# Patient Record
Sex: Female | Born: 1995 | Race: White | Hispanic: No | Marital: Married | Smoking: Current every day smoker
Health system: Southern US, Community
[De-identification: ages and names within clinical notes are randomized; demographics above are authoritative.]

## PROBLEM LIST (undated history)

## (undated) DIAGNOSIS — IMO0001 Reserved for inherently not codable concepts without codable children: Secondary | ICD-10-CM

## (undated) DIAGNOSIS — E282 Polycystic ovarian syndrome: Secondary | ICD-10-CM

## (undated) DIAGNOSIS — R42 Dizziness and giddiness: Secondary | ICD-10-CM

## (undated) DIAGNOSIS — N939 Abnormal uterine and vaginal bleeding, unspecified: Secondary | ICD-10-CM

## (undated) DIAGNOSIS — Z8709 Personal history of other diseases of the respiratory system: Secondary | ICD-10-CM

---
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
## 2016-11-02 ENCOUNTER — Encounter (HOSPITAL_COMMUNITY): Payer: Self-pay | Admitting: Obstetrics and Gynecology

## 2016-11-02 ENCOUNTER — Other Ambulatory Visit (HOSPITAL_COMMUNITY): Payer: Self-pay | Admitting: Obstetrics and Gynecology

## 2016-11-02 DIAGNOSIS — Z3A31 31 weeks gestation of pregnancy: Secondary | ICD-10-CM

## 2016-11-02 DIAGNOSIS — O35EXX0 Maternal care for other (suspected) fetal abnormality and damage, fetal genitourinary anomalies, not applicable or unspecified: Secondary | ICD-10-CM

## 2016-11-02 DIAGNOSIS — Z3689 Encounter for other specified antenatal screening: Secondary | ICD-10-CM

## 2016-11-02 DIAGNOSIS — O358XX0 Maternal care for other (suspected) fetal abnormality and damage, not applicable or unspecified: Secondary | ICD-10-CM

## 2016-11-10 ENCOUNTER — Encounter (HOSPITAL_COMMUNITY): Payer: Self-pay

## 2016-11-10 ENCOUNTER — Ambulatory Visit (HOSPITAL_COMMUNITY)
Admission: RE | Admit: 2016-11-10 | Discharge: 2016-11-10 | Disposition: A | Discharge status: Home / Self Care | Payer: Medicaid Other | Source: Ambulatory Visit | Attending: Obstetrics and Gynecology | Admitting: Obstetrics and Gynecology

## 2016-11-10 ENCOUNTER — Ambulatory Visit (HOSPITAL_COMMUNITY): Admission: RE | Admit: 2016-11-10 | Payer: Medicaid Other | Source: Ambulatory Visit

## 2016-11-10 DIAGNOSIS — O358XX0 Maternal care for other (suspected) fetal abnormality and damage, not applicable or unspecified: Secondary | ICD-10-CM

## 2016-11-10 DIAGNOSIS — O35EXX0 Maternal care for other (suspected) fetal abnormality and damage, fetal genitourinary anomalies, not applicable or unspecified: Secondary | ICD-10-CM

## 2016-11-10 DIAGNOSIS — O283 Abnormal ultrasonic finding on antenatal screening of mother: Secondary | ICD-10-CM | POA: Diagnosis not present

## 2016-11-10 DIAGNOSIS — Z3A31 31 weeks gestation of pregnancy: Secondary | ICD-10-CM

## 2016-11-10 DIAGNOSIS — Z363 Encounter for antenatal screening for malformations: Secondary | ICD-10-CM | POA: Insufficient documentation

## 2016-11-10 DIAGNOSIS — Z3689 Encounter for other specified antenatal screening: Secondary | ICD-10-CM

## 2016-11-10 HISTORY — DX: Polycystic ovarian syndrome: E28.2

## 2016-11-13 ENCOUNTER — Other Ambulatory Visit (HOSPITAL_COMMUNITY): Payer: Self-pay | Admitting: *Deleted

## 2016-11-13 DIAGNOSIS — O358XX0 Maternal care for other (suspected) fetal abnormality and damage, not applicable or unspecified: Secondary | ICD-10-CM

## 2016-11-13 DIAGNOSIS — O35EXX0 Maternal care for other (suspected) fetal abnormality and damage, fetal genitourinary anomalies, not applicable or unspecified: Secondary | ICD-10-CM

## 2016-11-14 ENCOUNTER — Other Ambulatory Visit (HOSPITAL_COMMUNITY): Payer: Self-pay

## 2016-11-14 ENCOUNTER — Encounter (HOSPITAL_COMMUNITY): Payer: Self-pay

## 2016-12-08 ENCOUNTER — Ambulatory Visit (HOSPITAL_COMMUNITY)
Admission: RE | Admit: 2016-12-08 | Discharge: 2016-12-08 | Disposition: A | Discharge status: Home / Self Care | Payer: Medicaid Other | Source: Ambulatory Visit | Attending: Obstetrics and Gynecology | Admitting: Obstetrics and Gynecology

## 2016-12-08 ENCOUNTER — Encounter (HOSPITAL_COMMUNITY): Payer: Self-pay

## 2016-12-08 DIAGNOSIS — Z3A35 35 weeks gestation of pregnancy: Secondary | ICD-10-CM | POA: Diagnosis not present

## 2016-12-08 DIAGNOSIS — O358XX0 Maternal care for other (suspected) fetal abnormality and damage, not applicable or unspecified: Secondary | ICD-10-CM

## 2016-12-08 DIAGNOSIS — O35EXX0 Maternal care for other (suspected) fetal abnormality and damage, fetal genitourinary anomalies, not applicable or unspecified: Secondary | ICD-10-CM

## 2016-12-08 DIAGNOSIS — O359XX0 Maternal care for (suspected) fetal abnormality and damage, unspecified, not applicable or unspecified: Secondary | ICD-10-CM | POA: Diagnosis not present

## 2016-12-25 NOTE — L&D Delivery Note (Signed)
Delivery Note Pt progressed to complete dilation and pushed once.  At 11:55 PM a healthy female was delivered via Vaginal, Spontaneous (Presentation: OA  ).  APGAR: 8, 9; weight pending .   Placenta status: Delivered spontaneously .  Cord:  with the following complications:nuchal x 1 delivered through .    Anesthesia: epidural  Episiotomy: None Lacerations: None Suture Repair: n/a Est. Blood Loss (mL): 175mL  Mom to postpartum.  Baby to Couplet care / Skin to Skin.   Pt declines circumcision Andrea Cooke 11/08/2017, 12:13 AM

## 2017-01-25 HISTORY — PX: OTHER SURGICAL HISTORY: SHX169

## 2017-05-15 LAB — OB RESULTS CONSOLE ABO/RH: RH Type: POSITIVE

## 2017-05-15 LAB — OB RESULTS CONSOLE RPR: RPR: NONREACTIVE

## 2017-05-15 LAB — OB RESULTS CONSOLE GC/CHLAMYDIA
CHLAMYDIA, DNA PROBE: NEGATIVE
Gonorrhea: NEGATIVE

## 2017-05-15 LAB — OB RESULTS CONSOLE RUBELLA ANTIBODY, IGM: Rubella: NON-IMMUNE/NOT IMMUNE

## 2017-05-15 LAB — OB RESULTS CONSOLE HEPATITIS B SURFACE ANTIGEN: HEP B S AG: NEGATIVE

## 2017-05-15 LAB — OB RESULTS CONSOLE ANTIBODY SCREEN: Antibody Screen: NEGATIVE

## 2017-05-15 LAB — OB RESULTS CONSOLE HIV ANTIBODY (ROUTINE TESTING): HIV: NONREACTIVE

## 2017-06-26 IMAGING — US US MFM OB FOLLOW-UP
1 series · 14 of 28 positions shown · non-contrast
Comparison: none

[Series 1: us mfm ob follow-up · 46 acquisitions, 14 frames shown]
[im 2/46]
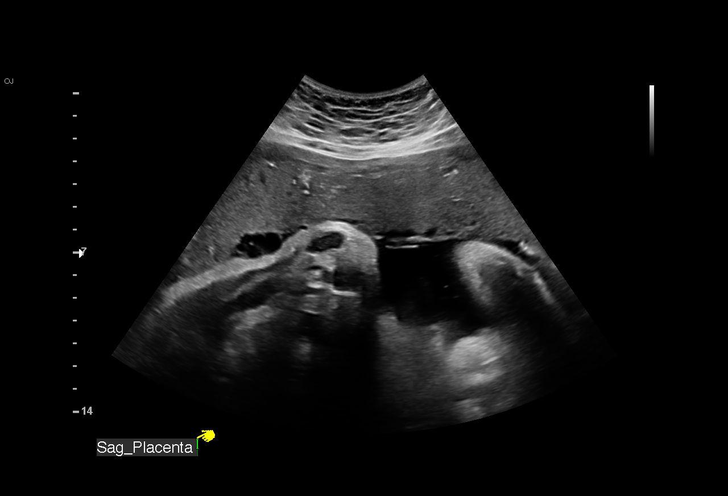
[im 6/46]
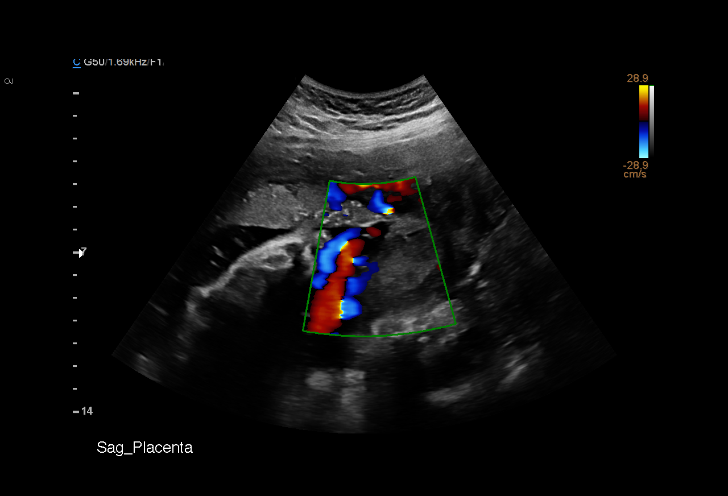
[im 9/46]
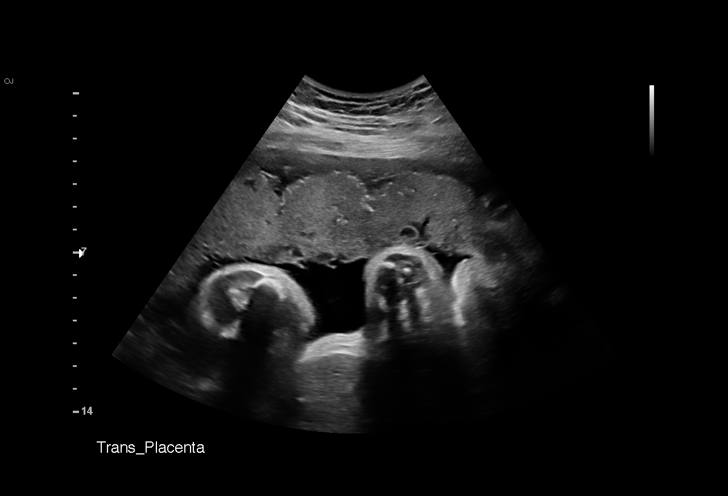
[im 12/46]
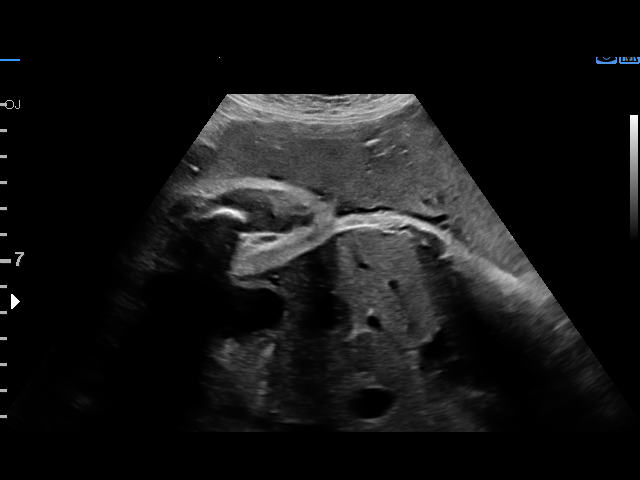
[im 16/46]
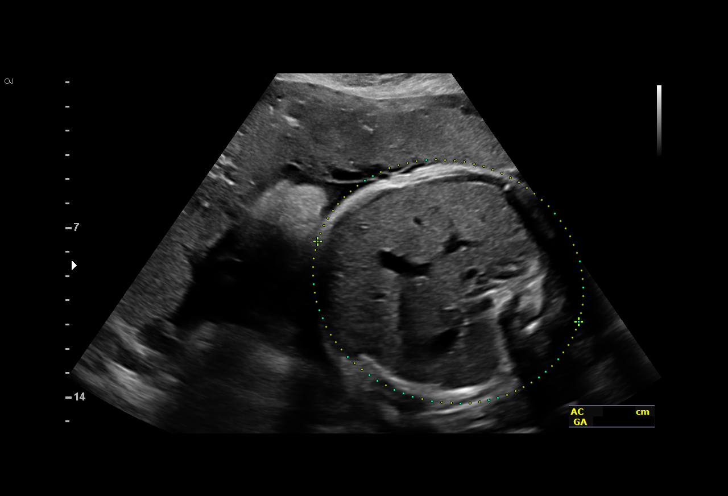
[im 19/46]
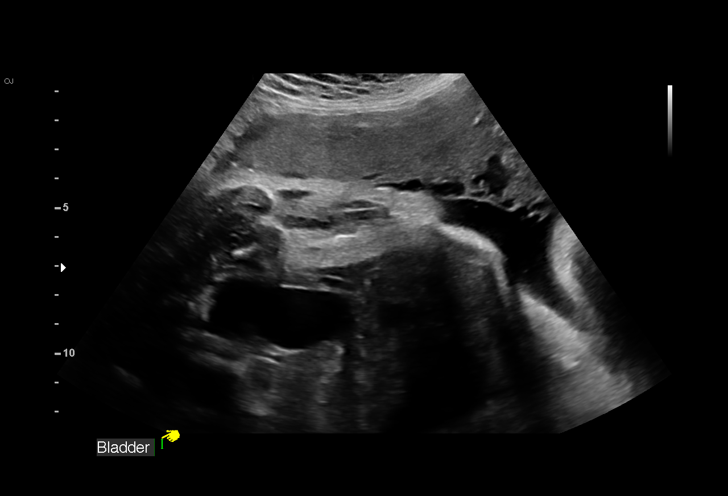
[im 22/46]
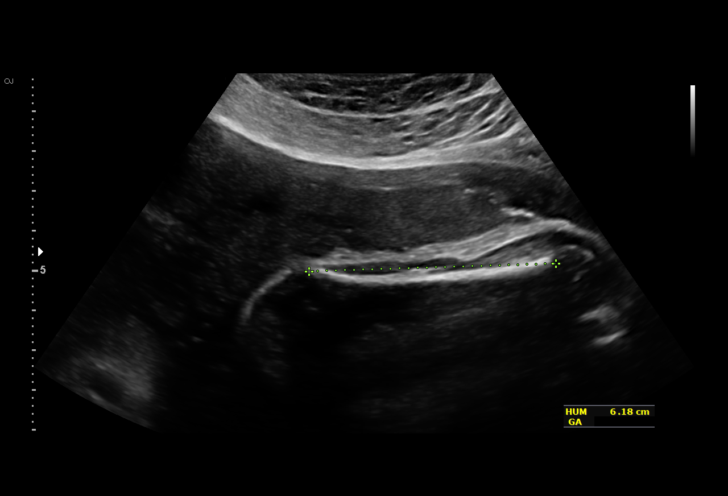
[im 26/46]
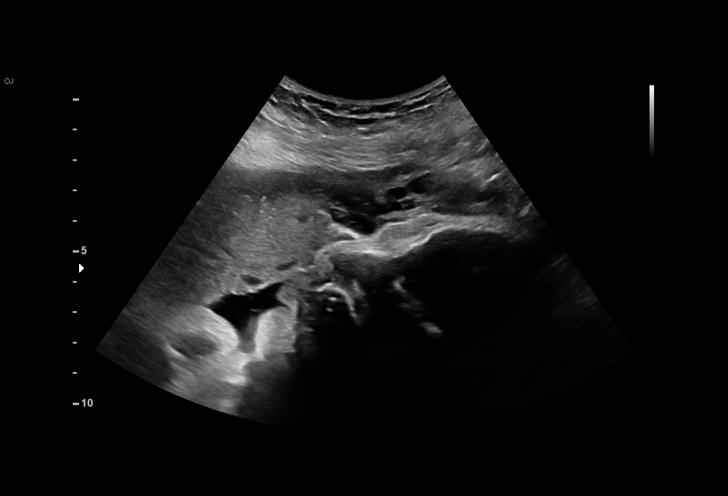
[im 29/46]
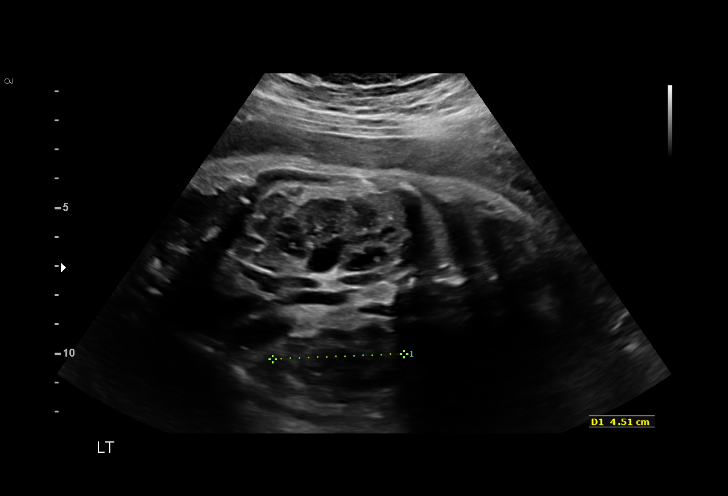
[im 32/46]
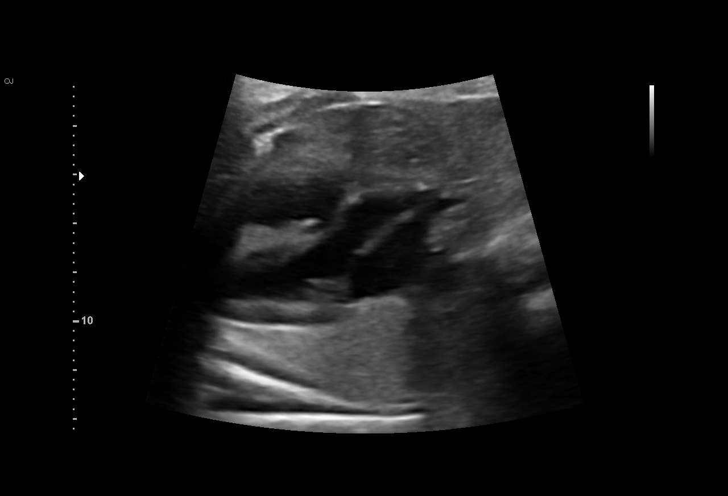
[im 36/46]
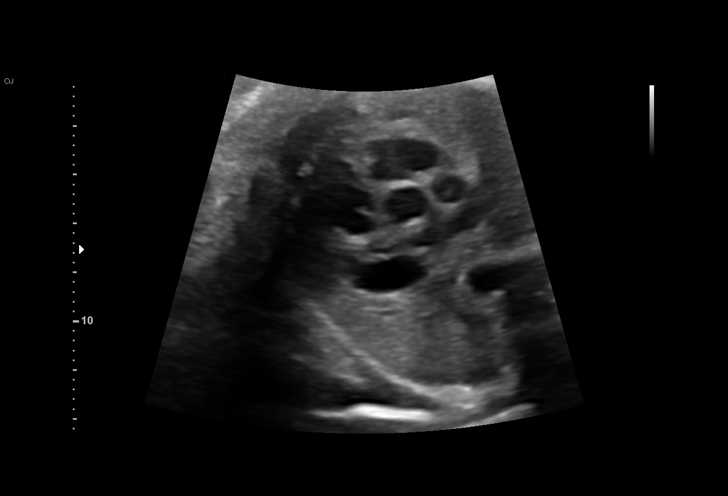
[im 39/46]
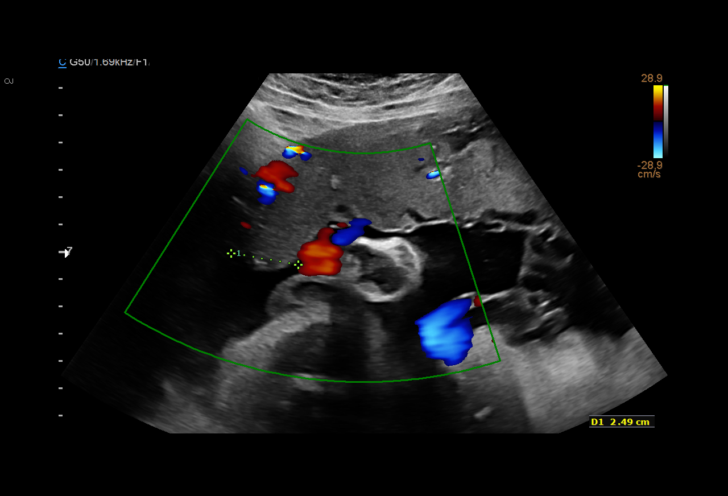
[im 42/46]
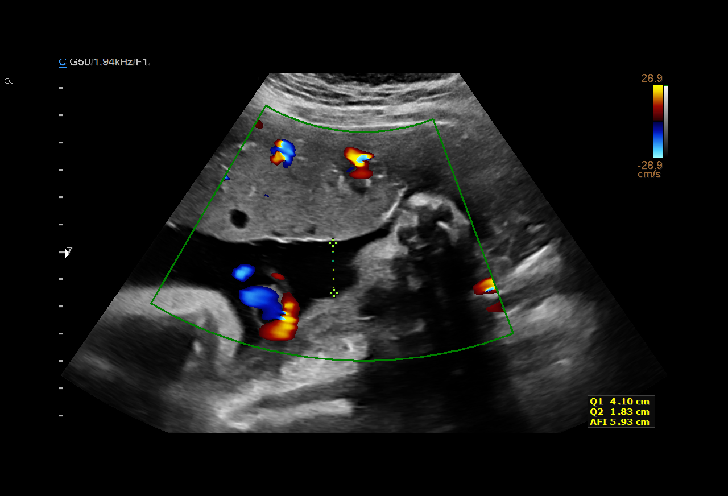
[im 46/46]
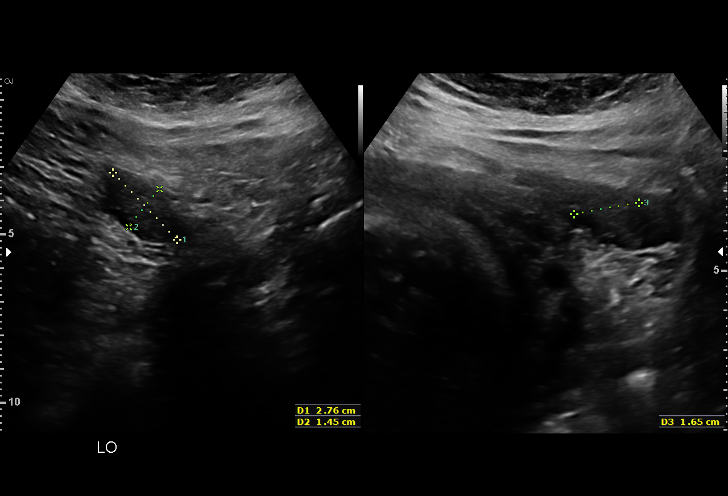

[14 of 28 positions shown; findings below may reference images not displayed]

KAYOMBERA DO

1  LATTINHO SILVIE              [id number]        [id number]     [id number]
Indications

35 weeks gestation of pregnancy
Encounter for other antenatal screening
follow-up
Fetal abnormality - other known or
suspected (i.e. choriod plexus cyst, EIF,
renal pyelectasis)
OB History

Gravidity:    1
Fetal Evaluation

Num Of Fetuses:     1
Fetal Heart         137
Rate(bpm):
Cardiac Activity:   Observed
Presentation:       Cephalic
Placenta:           Anterior, above cervical os
P. Cord Insertion:  Previously Visualized

Amniotic Fluid
AFI FV:      Subjectively within normal limits

AFI Sum(cm)     %Tile       Largest Pocket(cm)
10.24           23

RUQ(cm)                     LUQ(cm)        LLQ(cm)
4.1
Biometry
BPD:      85.3  mm     G. Age:  34w 2d         28  %    CI:        75.25   %    70 - 86
FL/HC:      21.4   %    20.1 -
HC:      311.9  mm     G. Age:  34w 6d         13  %    HC/AC:      0.93        0.93 -
AC:      334.5  mm     G. Age:  37w 2d         96  %    FL/BPD:     78.1   %    71 - 87
FL:       66.6  mm     G. Age:  34w 2d         20  %    FL/AC:      19.9   %    20 - 24
HUM:      62.5  mm     G. Age:  36w 2d         83  %

Est. FW:    3238  gm      6 lb 4 oz     76  %
Gestational Age

Clinical EDD:  35w 2d                                        EDD:   01/10/17
U/S Today:     35w 1d                                        EDD:   01/11/17
Best:          35w 2d     Det. By:  Clinical EDD             EDD:   01/10/17
Anatomy

Cranium:               Appears normal         Aortic Arch:            Previously seen
Cavum:                 Appears normal         Ductal Arch:            Previously seen
Ventricles:            Appears normal         Diaphragm:              Appears normal
Choroid Plexus:        Previously seen        Stomach:                Appears normal, left
sided
Cerebellum:            Previously seen        Abdomen:                Appears normal
Posterior Fossa:       Not well visualized    Abdominal Wall:         Previously seen
Nuchal Fold:           Not applicable (>20    Cord Vessels:           Appears normal (3
wks GA)                                        vessel cord)
Face:                  Orbits nl; profile not Kidneys:                Appear normal
well visualized
Lips:                  Previously seen        Bladder:                Appears normal
Thoracic:              Appears normal         Spine:                  Not well visualized
Heart:                 Appears normal         Upper Extremities:      Previously seen
(4CH, axis, and situs
RVOT:                  Appears normal         Lower Extremities:      Previously seen
LVOT:                  Appears normal

Other:  Fetus appears to be a male. Nasal bone prev. visualized.
Cervix Uterus Adnexa

Cervix
Not visualized (advanced GA >81wks)

Uterus
No abnormality visualized.

Left Ovary
Within normal limits.

Right Ovary
Within normal limits.
Impression

Singleton intrauterine pregnancy at 35+2 weeks with
pyelectasis on office US
Review of the anatomy shows minimal fetal pyelectasis, 5
mm on both left and right. Otherwise there are no
sonographic markers for aneuploidy or structural anomalies
seen today.
Amniotic fluid volume is normal with an AFI of 10.2 cm
Estimated fetal weight is 2821g which is growth in the 76th
percentile
Recommendations

Would recommend informing the pediatrician of the mild
pyelectasis so that postnatal follow up can be done. No
further US visits are necessary in MFM

## 2017-09-15 ENCOUNTER — Encounter (HOSPITAL_COMMUNITY): Payer: Self-pay

## 2017-10-29 ENCOUNTER — Encounter (HOSPITAL_COMMUNITY): Payer: Self-pay | Admitting: *Deleted

## 2017-10-29 ENCOUNTER — Telehealth (HOSPITAL_COMMUNITY): Payer: Self-pay | Admitting: *Deleted

## 2017-10-29 NOTE — Telephone Encounter (Signed)
Preadmission screen  

## 2017-11-06 ENCOUNTER — Inpatient Hospital Stay (HOSPITAL_COMMUNITY)
Admission: AD | Admit: 2017-11-06 | Discharge: 2017-11-06 | Disposition: A | Discharge status: Home / Self Care | Payer: Medicaid Other | Source: Ambulatory Visit | Attending: Obstetrics and Gynecology | Admitting: Obstetrics and Gynecology

## 2017-11-06 ENCOUNTER — Encounter (HOSPITAL_COMMUNITY): Payer: Self-pay | Admitting: *Deleted

## 2017-11-06 ENCOUNTER — Other Ambulatory Visit: Payer: Self-pay

## 2017-11-06 DIAGNOSIS — O479 False labor, unspecified: Secondary | ICD-10-CM

## 2017-11-06 NOTE — MAU Note (Signed)
Urine in lab 

## 2017-11-06 NOTE — MAU Note (Signed)
I have communicated with Dr.Bovard and reviewed vital signs:  Vitals:   11/06/17 1816 11/06/17 2027  BP: 122/77 137/70  Pulse: (!) 101 88  Resp: 18 16  Temp: 98.1 F (36.7 C)   SpO2: 100%     Vaginal exam:  Dilation: 4 Effacement (%): 70 Cervical Position: Posterior Station: -3 Presentation: Vertex Exam by:: K.WeissRN,   Also reviewed contraction pattern and that non-stress test is reactive.  It has been documented that patient is contracting every 5-6 minutes with no cervical change over 2  hours not indicating active labor.  Patient denies any other complaints.  Based on this report provider has given order for discharge.  A discharge order and diagnosis entered by a provider.   Labor discharge instructions reviewed with patient.

## 2017-11-06 NOTE — MAU Note (Signed)
Contractions are about 5-6 min apart. Also having lower back cramps.  Was 4 cm when last checked.  Some wetness, ? Leaking or urine

## 2017-11-06 NOTE — Discharge Instructions (Signed)

## 2017-11-07 ENCOUNTER — Encounter (HOSPITAL_COMMUNITY): Payer: Self-pay | Admitting: *Deleted

## 2017-11-07 ENCOUNTER — Inpatient Hospital Stay (HOSPITAL_COMMUNITY)
Admission: AD | Admit: 2017-11-07 | Discharge: 2017-11-09 | DRG: 807 | Disposition: A | Discharge status: Home / Self Care | Payer: Medicaid Other | Source: Ambulatory Visit | Attending: Obstetrics and Gynecology | Admitting: Obstetrics and Gynecology

## 2017-11-07 ENCOUNTER — Inpatient Hospital Stay (HOSPITAL_COMMUNITY): Payer: Medicaid Other | Admitting: Anesthesiology

## 2017-11-07 ENCOUNTER — Other Ambulatory Visit: Payer: Self-pay

## 2017-11-07 DIAGNOSIS — Z3490 Encounter for supervision of normal pregnancy, unspecified, unspecified trimester: Secondary | ICD-10-CM

## 2017-11-07 DIAGNOSIS — Z3A38 38 weeks gestation of pregnancy: Secondary | ICD-10-CM | POA: Diagnosis not present

## 2017-11-07 DIAGNOSIS — O99334 Smoking (tobacco) complicating childbirth: Secondary | ICD-10-CM | POA: Diagnosis present

## 2017-11-07 DIAGNOSIS — O99214 Obesity complicating childbirth: Secondary | ICD-10-CM | POA: Diagnosis present

## 2017-11-07 DIAGNOSIS — F17210 Nicotine dependence, cigarettes, uncomplicated: Secondary | ICD-10-CM | POA: Diagnosis present

## 2017-11-07 DIAGNOSIS — Z3483 Encounter for supervision of other normal pregnancy, third trimester: Secondary | ICD-10-CM | POA: Diagnosis present

## 2017-11-07 LAB — OB RESULTS CONSOLE GBS: GBS: NEGATIVE

## 2017-11-07 LAB — TYPE AND SCREEN
ABO/RH(D): O POS
ANTIBODY SCREEN: NEGATIVE

## 2017-11-07 LAB — CBC
HCT: 33.9 % — ABNORMAL LOW (ref 36.0–46.0)
Hemoglobin: 11.1 g/dL — ABNORMAL LOW (ref 12.0–15.0)
MCH: 29.1 pg (ref 26.0–34.0)
MCHC: 32.7 g/dL (ref 30.0–36.0)
MCV: 89 fL (ref 78.0–100.0)
Platelets: 168 10*3/uL (ref 150–400)
RBC: 3.81 MIL/uL — ABNORMAL LOW (ref 3.87–5.11)
RDW: 13.2 % (ref 11.5–15.5)
WBC: 9.4 10*3/uL (ref 4.0–10.5)

## 2017-11-07 LAB — ABO/RH: ABO/RH(D): O POS

## 2017-11-07 LAB — POCT FERN TEST: POCT Fern Test: POSITIVE

## 2017-11-07 MED ORDER — DIPHENHYDRAMINE HCL 50 MG/ML IJ SOLN: 12.5000 mg | INTRAMUSCULAR | Status: DC | PRN

## 2017-11-07 MED ORDER — PHENYLEPHRINE 40 MCG/ML (10ML) SYRINGE FOR IV PUSH (FOR BLOOD PRESSURE SUPPORT): 80.0000 ug | PREFILLED_SYRINGE | INTRAVENOUS | Status: DC | PRN

## 2017-11-07 MED ORDER — EPHEDRINE 5 MG/ML INJ: 10.0000 mg | INTRAVENOUS | Status: DC | PRN

## 2017-11-07 MED ORDER — EPHEDRINE 5 MG/ML INJ
10.0000 mg | INTRAVENOUS | Status: DC | PRN
  Filled 2017-11-07: qty 2

## 2017-11-07 MED ORDER — LACTATED RINGERS IV SOLN: 500.0000 mL | INTRAVENOUS | Status: DC | PRN

## 2017-11-07 MED ORDER — TERBUTALINE SULFATE 1 MG/ML IJ SOLN
0.2500 mg | Freq: Once | INTRAMUSCULAR | Status: DC | PRN
  Filled 2017-11-07: qty 1

## 2017-11-07 MED ORDER — OXYTOCIN 40 UNITS IN LACTATED RINGERS INFUSION - SIMPLE MED
1.0000 m[IU]/min | INTRAVENOUS | Status: DC
  Administered 2017-11-07: 2 m[IU]/min via INTRAVENOUS
  Filled 2017-11-07: qty 1000

## 2017-11-07 MED ORDER — ONDANSETRON HCL 4 MG/2ML IJ SOLN
4.0000 mg | Freq: Four times a day (QID) | INTRAMUSCULAR | Status: DC | PRN
  Administered 2017-11-07: 4 mg via INTRAVENOUS
  Filled 2017-11-07: qty 2

## 2017-11-07 MED ORDER — LACTATED RINGERS IV SOLN
INTRAVENOUS | Status: DC
  Administered 2017-11-07: 21:00:00 via INTRAUTERINE

## 2017-11-07 MED ORDER — LACTATED RINGERS IV SOLN: 500.0000 mL | Freq: Once | INTRAVENOUS | Status: DC

## 2017-11-07 MED ORDER — PHENYLEPHRINE 40 MCG/ML (10ML) SYRINGE FOR IV PUSH (FOR BLOOD PRESSURE SUPPORT)
80.0000 ug | PREFILLED_SYRINGE | INTRAVENOUS | Status: DC | PRN
  Filled 2017-11-07: qty 5
  Filled 2017-11-07: qty 10

## 2017-11-07 MED ORDER — OXYCODONE-ACETAMINOPHEN 5-325 MG PO TABS: 1.0000 | ORAL_TABLET | ORAL | Status: DC | PRN

## 2017-11-07 MED ORDER — SOD CITRATE-CITRIC ACID 500-334 MG/5ML PO SOLN: 30.0000 mL | ORAL | Status: DC | PRN

## 2017-11-07 MED ORDER — OXYTOCIN 40 UNITS IN LACTATED RINGERS INFUSION - SIMPLE MED: 2.5000 [IU]/h | INTRAVENOUS | Status: DC

## 2017-11-07 MED ORDER — ACETAMINOPHEN 325 MG PO TABS: 650.0000 mg | ORAL_TABLET | ORAL | Status: DC | PRN

## 2017-11-07 MED ORDER — OXYCODONE-ACETAMINOPHEN 5-325 MG PO TABS: 2.0000 | ORAL_TABLET | ORAL | Status: DC | PRN

## 2017-11-07 MED ORDER — LACTATED RINGERS IV SOLN
500.0000 mL | Freq: Once | INTRAVENOUS | Status: DC
Start: 2017-11-07 — End: 2017-11-08

## 2017-11-07 MED ORDER — FLEET ENEMA 7-19 GM/118ML RE ENEM
1.0000 | ENEMA | RECTAL | Status: DC | PRN
Start: 2017-11-07 — End: 2017-11-08

## 2017-11-07 MED ORDER — FENTANYL 2.5 MCG/ML BUPIVACAINE 1/10 % EPIDURAL INFUSION (WH - ANES): 14.0000 mL/h | INTRAMUSCULAR | Status: DC | PRN

## 2017-11-07 MED ORDER — LIDOCAINE HCL (PF) 1 % IJ SOLN: 30.0000 mL | INTRAMUSCULAR | Status: DC | PRN

## 2017-11-07 MED ORDER — FENTANYL 2.5 MCG/ML BUPIVACAINE 1/10 % EPIDURAL INFUSION (WH - ANES)
14.0000 mL/h | INTRAMUSCULAR | Status: DC | PRN
  Administered 2017-11-07 (×3): 14 mL/h via EPIDURAL
  Filled 2017-11-07 (×3): qty 100

## 2017-11-07 MED ORDER — PHENYLEPHRINE 40 MCG/ML (10ML) SYRINGE FOR IV PUSH (FOR BLOOD PRESSURE SUPPORT)
80.0000 ug | PREFILLED_SYRINGE | INTRAVENOUS | Status: DC | PRN
  Filled 2017-11-07: qty 5

## 2017-11-07 MED ORDER — LIDOCAINE HCL (PF) 1 % IJ SOLN
INTRAMUSCULAR | Status: AC
  Filled 2017-11-07: qty 30

## 2017-11-07 MED ORDER — LACTATED RINGERS IV SOLN
INTRAVENOUS | Status: DC
  Administered 2017-11-07: 15:00:00 via INTRAVENOUS

## 2017-11-07 MED ORDER — OXYTOCIN BOLUS FROM INFUSION
500.0000 mL | Freq: Once | INTRAVENOUS | Status: AC
  Administered 2017-11-08: 500 mL via INTRAVENOUS

## 2017-11-07 MED ORDER — LIDOCAINE HCL (PF) 1 % IJ SOLN
INTRAMUSCULAR | Status: DC | PRN
  Administered 2017-11-07 (×2): 4 mL

## 2017-11-07 NOTE — Anesthesia Procedure Notes (Signed)
Epidural Patient location during procedure: OB  Staffing Anesthesiologist: Lewie LoronGermeroth, Jazaria Jarecki, MD Performed: anesthesiologist   Preanesthetic Checklist Completed: patient identified, pre-op evaluation, timeout performed, IV checked, risks and benefits discussed and monitors and equipment checked  Epidural Patient position: sitting Prep: site prepped and draped and DuraPrep Patient monitoring: heart rate, blood pressure and continuous pulse ox Approach: midline Location: L3-L4 Injection technique: LOR air and LOR saline  Needle:  Needle type: Tuohy  Needle gauge: 17 G Needle length: 9 cm Needle insertion depth: 7 cm Catheter type: closed end flexible Catheter size: 19 Gauge Catheter at skin depth: 12 cm Test dose: negative  Assessment Sensory level: T8 Events: blood not aspirated, injection not painful, no injection resistance, negative IV test and no paresthesia  Additional Notes Reason for block:procedure for pain

## 2017-11-07 NOTE — H&P (Signed)
Andrea Cooke is a 21 y.o. female G2P10Lyman Bishop01 at 7638 6/7 weeks (EDD 11/15/17 by LMP c/w 15 week US)  presenting for SROM and contractions.  She just delivered in 1/18. Prenatal care complicated by smoking, cut down with pregnancy.  Rubella non-immune status,  She had a h/o shoulder dystocia with last pregnancy of 7#15oz infant.  EFW 10/25/17 77%ile 7#9oz  OB History    Gravida Para Term Preterm AB Living   2 1       1    SAB TAB Ectopic Multiple Live Births           1    NSVD 1/18  Past Medical History:  Diagnosis Date  . Asthma   . PCOS (polycystic ovarian syndrome)    Past Surgical History:  Procedure Laterality Date  . CYSTECTOMY     from neck  . NO PAST SURGERIES     Family History: family history is not on file. Social History:  reports that she has been smoking cigarettes.  She has been smoking about 0.25 packs per day. she has never used smokeless tobacco. She reports that she does not drink alcohol or use drugs.     Maternal Diabetes: No Genetic Screening: Normal Maternal Ultrasounds/Referrals: Normal Fetal Ultrasounds or other Referrals:  None Maternal Substance Abuse:  Yes:  Type: Smoker Significant Maternal Medications:  None Significant Maternal Lab Results:  None Other Comments:  None  Review of Systems  Gastrointestinal: Positive for abdominal pain.  Neurological: Negative for headaches.   Maternal Medical History:  Reason for admission: Rupture of membranes.   Contractions: Onset was 3-5 hours ago.   Perceived severity is moderate.    Fetal activity: Perceived fetal activity is normal.    Prenatal Complications - Diabetes: none.    Dilation: 4.5 Effacement (%): 80 Station: -1 Exam by:: Millner, RN  Blood pressure (!) 113/57, pulse 78, temperature (!) 97.4 F (36.3 C), temperature source Oral, resp. rate 16, height 5\' 3"  (1.6 m), weight 103 kg (227 lb), last menstrual period 02/08/2017, unknown if currently breastfeeding. Exam Physical Exam   Constitutional: She appears well-developed.  Cardiovascular: Normal rate and regular rhythm.  Respiratory: Effort normal.  GI: Soft.  Genitourinary: Vagina normal.  Neurological: She is alert.  Psychiatric: She has a normal mood and affect.    Prenatal labs: ABO, Rh: --/--/O POS (11/14 1230) Antibody: PENDING (11/14 1230) Rubella: Nonimmune (05/22 0000) RPR: Nonreactive (05/22 0000)  HBsAg: Negative (05/22 0000)  HIV: Non-reactive (05/22 0000)  GBS: Negative (11/14 0000)  One hour 142 Three hour GTT 69/111/97/81 Tetra negative  Assessment/Plan: Pt admitted with contractions, to get epidural.  Will augment if needed.  Oliver PilaKathy W Jermya Dowding 11/07/2017, 2:02 PM

## 2017-11-07 NOTE — Anesthesia Pain Management Evaluation Note (Signed)
  CRNA Pain Management Visit Note  Patient: Andrea Cooke, 21 y.o., female  "Hello I am a member of the anesthesia team at [facility]. We have an anesthesia team available at all times to provide care throughout the hospital, including epidural management and anesthesia for C-section. I don't know your plan for the delivery whether it a natural birth, water birth, IV sedation, nitrous supplementation, doula or epidural, but we want to meet your pain goals."   1.Was your pain managed to your expectations on prior hospitalizations?   Yes   2.What is your expectation for pain management during this hospitalization?     Epidural  3.How can we help you reach that goal? Some pain on right side but decreasing after bolus given by RN. I told her to ask RN to call anesthesia if pain on right does not not continue to resolve because epidural catheter may eed to be pulled back to make block equal. Record the patient's initial score and the patient's pain goal.   Pain: 5, on right  Pain Goal: 6 [facility] wants you to be able to say your pain was always managed very well.  Vaughn Beaumier 11/07/2017

## 2017-11-07 NOTE — Progress Notes (Signed)
Patient ID: Andrea Cooke, female   DOB: 1996-05-02, 21 y.o.   MRN: [id number] Pt comfortable with epidural  90/5/-1 AROM forebag and IUPC placed  Will follow progress, augment if needed

## 2017-11-07 NOTE — Progress Notes (Signed)
Patient ID: Andrea Cooke, female   DOB: 30-May-1996, 21 y.o.   MRN: [id number] Late entry note  Pt examined around 735pm and noted to have no cervical change.  The IUPC had inadvertantly been pulled out when pt changing positions, so was replaced.  Fetal head still felt asynclitic  8-9/90/-1  Restarted pitocin as no cervical change and contractions not adequate.  At 2mu began to have variable decels to 90's from 120 baseline with good variability and recovery to baseline between contractions.   We have started an amnioinfusion and will see if helps the variables.   Following the tracing closely.

## 2017-11-07 NOTE — Progress Notes (Signed)
Patient ID: Andrea Cooke, female   DOB: 1996-12-22, 21 y.o.   MRN: [id number] Pt comfortable with epidural FHR baseline 120 with some variable decels that are dependent on position  Cervix 8/80/-1  Head feels asynclitic/OP  Will  Try to place in exaggerated Sims if baby tolerates Pitocin never started due to the variables and MVU almost adequate

## 2017-11-07 NOTE — Anesthesia Preprocedure Evaluation (Signed)
Anesthesia Evaluation  Patient identified by MRN, date of birth, ID band Patient awake    Reviewed: Allergy & Precautions, NPO status , Patient's Chart, lab work & pertinent test results  History of Anesthesia Complications (+) PONV  Airway Mallampati: II  TM Distance: >3 FB Neck ROM: Full    Dental no notable dental hx.    Pulmonary asthma , Current Smoker,    Pulmonary exam normal breath sounds clear to auscultation       Cardiovascular negative cardio ROS Normal cardiovascular exam Rhythm:Regular Rate:Normal     Neuro/Psych negative neurological ROS  negative psych ROS   GI/Hepatic negative GI ROS, Neg liver ROS,   Endo/Other  Morbid obesity  Renal/GU negative Renal ROS     Musculoskeletal negative musculoskeletal ROS (+)   Abdominal   Peds  Hematology negative hematology ROS (+)   Anesthesia Other Findings   Reproductive/Obstetrics (+) Pregnancy                             Anesthesia Physical Anesthesia Plan  ASA: III  Anesthesia Plan: Epidural   Post-op Pain Management:    Induction: Intravenous  PONV Risk Score and Plan: 4 or greater and Ondansetron and Treatment may vary due to age or medical condition  Airway Management Planned:   Additional Equipment:   Intra-op Plan:   Post-operative Plan:   Informed Consent: I have reviewed the patients History and Physical, chart, labs and discussed the procedure including the risks, benefits and alternatives for the proposed anesthesia with the patient or authorized representative who has indicated his/her understanding and acceptance.     Plan Discussed with:   Anesthesia Plan Comments:         Anesthesia Quick Evaluation

## 2017-11-07 NOTE — MAU Note (Signed)
Pt. Here due to SROM at 1000 a.m. This morning, with occasional gushes of clear fluid. Positive for fetal movement, denies vaginal bleeding. EFM - 140s, toco applied - abd. Soft.

## 2017-11-07 NOTE — Progress Notes (Signed)
Patient ID: Andrea Cooke, female   DOB: 1996/11/17, 21 y.o.   MRN: [id number] Variables improved with amnioinfusion  afeb vss FHR now category 1  Cervix c/9+/-1  Still a bit of cervix on pt right and anterior. Head coming down more. Will change to left side and recheck in an hour

## 2017-11-08 ENCOUNTER — Encounter (HOSPITAL_COMMUNITY): Payer: Self-pay

## 2017-11-08 LAB — CBC
HEMATOCRIT: 33.1 % — AB (ref 36.0–46.0)
HEMOGLOBIN: 10.9 g/dL — AB (ref 12.0–15.0)
MCH: 29.5 pg (ref 26.0–34.0)
MCHC: 32.9 g/dL (ref 30.0–36.0)
MCV: 89.7 fL (ref 78.0–100.0)
Platelets: 145 10*3/uL — ABNORMAL LOW (ref 150–400)
RBC: 3.69 MIL/uL — AB (ref 3.87–5.11)
RDW: 12.9 % (ref 11.5–15.5)
WBC: 15.2 10*3/uL — ABNORMAL HIGH (ref 4.0–10.5)

## 2017-11-08 LAB — RPR: RPR: NONREACTIVE

## 2017-11-08 MED ORDER — DIBUCAINE 1 % RE OINT: 1.0000 "application " | TOPICAL_OINTMENT | RECTAL | Status: DC | PRN

## 2017-11-08 MED ORDER — DIPHENHYDRAMINE HCL 25 MG PO CAPS: 25.0000 mg | ORAL_CAPSULE | Freq: Four times a day (QID) | ORAL | Status: DC | PRN

## 2017-11-08 MED ORDER — ACETAMINOPHEN 325 MG PO TABS: 650.0000 mg | ORAL_TABLET | ORAL | Status: DC | PRN

## 2017-11-08 MED ORDER — OXYCODONE HCL 5 MG PO TABS: 5.0000 mg | ORAL_TABLET | ORAL | Status: DC | PRN

## 2017-11-08 MED ORDER — COCONUT OIL OIL: 1.0000 "application " | TOPICAL_OIL | Status: DC | PRN

## 2017-11-08 MED ORDER — BENZOCAINE-MENTHOL 20-0.5 % EX AERO: 1.0000 "application " | INHALATION_SPRAY | CUTANEOUS | Status: DC | PRN

## 2017-11-08 MED ORDER — PRENATAL MULTIVITAMIN CH
1.0000 | ORAL_TABLET | Freq: Every day | ORAL | Status: DC
  Filled 2017-11-08: qty 1

## 2017-11-08 MED ORDER — ONDANSETRON HCL 4 MG PO TABS: 4.0000 mg | ORAL_TABLET | ORAL | Status: DC | PRN

## 2017-11-08 MED ORDER — IBUPROFEN 600 MG PO TABS
600.0000 mg | ORAL_TABLET | Freq: Four times a day (QID) | ORAL | Status: DC
  Administered 2017-11-08 (×3): 600 mg via ORAL
  Filled 2017-11-08 (×4): qty 1

## 2017-11-08 MED ORDER — OXYCODONE HCL 5 MG PO TABS: 10.0000 mg | ORAL_TABLET | ORAL | Status: DC | PRN

## 2017-11-08 MED ORDER — WITCH HAZEL-GLYCERIN EX PADS: 1.0000 "application " | MEDICATED_PAD | CUTANEOUS | Status: DC | PRN

## 2017-11-08 MED ORDER — SENNOSIDES-DOCUSATE SODIUM 8.6-50 MG PO TABS
2.0000 | ORAL_TABLET | ORAL | Status: DC
  Filled 2017-11-08: qty 2

## 2017-11-08 MED ORDER — ZOLPIDEM TARTRATE 5 MG PO TABS: 5.0000 mg | ORAL_TABLET | Freq: Every evening | ORAL | Status: DC | PRN

## 2017-11-08 MED ORDER — SIMETHICONE 80 MG PO CHEW: 80.0000 mg | CHEWABLE_TABLET | ORAL | Status: DC | PRN

## 2017-11-08 MED ORDER — ONDANSETRON HCL 4 MG/2ML IJ SOLN: 4.0000 mg | INTRAMUSCULAR | Status: DC | PRN

## 2017-11-08 MED ORDER — TETANUS-DIPHTH-ACELL PERTUSSIS 5-2.5-18.5 LF-MCG/0.5 IM SUSP: 0.5000 mL | Freq: Once | INTRAMUSCULAR | Status: DC

## 2017-11-08 NOTE — Progress Notes (Signed)
PPD #1 No problems Afeb, VSS Fundus firm, NT at U-1 Continue routine postpartum care 

## 2017-11-08 NOTE — Lactation Note (Signed)
This note was copied from a baby's chart. Lactation Consultation Note  Patient Name: Andrea Cooke WGNFA'OToday's Date: 11/08/2017  Breastfeeding consultation services and support information given to mom.  Baby is 7316 hours old and has been formula fed per mom's choice.  She states she does not desire to put baby to breast but will pump when she gets home.  Offered to set mom up with DEBP but she declined.  Encouraged to call with concerns.   Maternal Data    Feeding Feeding Type: Formula  LATCH Score                   Interventions    Lactation Tools Discussed/Used     Consult Status      Huston FoleyMOULDEN, Yisroel Mullendore S 11/08/2017, 4:05 PM

## 2017-11-08 NOTE — Anesthesia Postprocedure Evaluation (Signed)
Anesthesia Post Note  Patient: Andrea Cooke  Procedure(s) Performed: AN AD HOC LABOR EPIDURAL     Patient location during evaluation: Mother Baby Anesthesia Type: Epidural Level of consciousness: awake and alert and oriented Pain management: pain level controlled Vital Signs Assessment: post-procedure vital signs reviewed and stable Respiratory status: spontaneous breathing and nonlabored ventilation Cardiovascular status: stable Postop Assessment: no headache, no apparent nausea or vomiting, no backache, adequate PO intake, epidural receding and patient able to bend at knees Anesthetic complications: no    Last Vitals:  Vitals:   11/08/17 0334 11/08/17 0845  BP: 128/71 105/62  Pulse: 81 79  Resp: 16 16  Temp: 37.6 C 36.8 C  SpO2: 99%     Last Pain:  Vitals:   11/08/17 1057  TempSrc:   PainSc: 0-No pain   Pain Goal:                 [name],[name]

## 2017-11-09 ENCOUNTER — Inpatient Hospital Stay (HOSPITAL_COMMUNITY): Payer: Self-pay

## 2017-11-09 MED ORDER — IBUPROFEN 600 MG PO TABS: 600.0000 mg | ORAL_TABLET | Freq: Four times a day (QID) | ORAL | 1 refills | Status: DC | PRN

## 2017-11-09 MED ORDER — PRENATAL MULTIVITAMIN CH: 1.0000 | ORAL_TABLET | Freq: Every day | ORAL | 3 refills | Status: DC

## 2017-11-09 NOTE — Progress Notes (Signed)
Post Partum Day 2 Subjective: no complaints, up ad lib, voiding, tolerating PO and nl lochia, pain controlled  Objective: Blood pressure 118/71, pulse 79, temperature 98 F (36.7 C), temperature source Oral, resp. rate 18, height 5\' 3"  (1.6 m), weight 103 kg (227 lb), last menstrual period 02/08/2017, SpO2 99 %, unknown if currently breastfeeding.  Physical Exam:  General: alert and no distress Lochia: appropriate Uterine Fundus: firm  Recent Labs    11/07/17 1230 11/08/17 0638  HGB 11.1* 10.9*  HCT 33.9* 33.1*    Assessment/Plan: Discharge home, Breastfeeding and Lactation consult.  Routine PP care.  D/c home with motrin and PNV, f/u 6 weeks   LOS: 2 days   Arriana Lohmann Bovard-Stuckert 11/09/2017, 7:06 AM

## 2017-11-09 NOTE — Discharge Summary (Signed)
OB Discharge Summary     Patient Name: Andrea Cooke DOB: 10-08-1996 MRN: [id number]  Date of admission: 11/07/2017 Delivering MD: Huel CoteICHARDSON, KATHY   Date of discharge: 11/09/2017  Admitting diagnosis: 38wks CTX 5mins water broke on way here Intrauterine pregnancy: 2655w6d     Secondary diagnosis:  Active Problems:   Pregnancy   NSVD (normal spontaneous vaginal delivery)  Additional problems: n/a     Discharge diagnosis: Term Pregnancy Delivered                                                                                                Post partum procedures:none  Augmentation: AROM and Pitocin  Complications: None  Hospital course:  Onset of Labor With Vaginal Delivery     21 y.o. yo G2P1002 at 2555w6d was admitted in Latent Labor on 11/07/2017. Patient had an uncomplicated labor course as follows:  Membrane Rupture Time/Date: 2:15 PM ,11/07/2017   Intrapartum Procedures: Episiotomy: None [1]                                         Lacerations:  None [1]  Patient had a delivery of a Viable infant. 11/07/2017  Information for the patient's newborn:  Wyline Copasewnam, Boy Shellyann [[id number]][[id number]]  Delivery Method: Vaginal, Spontaneous(Filed from Delivery Summary)    Pateint had an uncomplicated postpartum course.  She is ambulating, tolerating a regular diet, passing flatus, and urinating well. Patient is discharged home in stable condition on 11/09/17.   Physical exam  Vitals:   11/08/17 0334 11/08/17 0845 11/08/17 1926 11/09/17 0603  BP: 128/71 105/62 112/73 118/71  Pulse: 81 79 87 79  Resp: 16 16 18 18   Temp: 99.7 F (37.6 C) 98.2 F (36.8 C) 98.1 F (36.7 C) 98 F (36.7 C)  TempSrc: Oral Oral Oral Oral  SpO2: 99%     Weight:      Height:       General: alert and no distress Lochia: appropriate Uterine Fundus: firm  Labs: Lab Results  Component Value Date   WBC 15.2 (H) 11/08/2017   HGB 10.9 (L) 11/08/2017   HCT 33.1 (L) 11/08/2017   MCV 89.7 11/08/2017   PLT  145 (L) 11/08/2017   No flowsheet data found.  Discharge instruction: per After Visit Summary and "Baby and Me Booklet".  After visit meds:  Allergies as of 11/09/2017   No Known Allergies     Medication List    TAKE these medications   acetaminophen 500 MG tablet Commonly known as:  TYLENOL Take 1,000 mg every 6 (six) hours as needed by mouth for moderate pain.   ibuprofen 600 MG tablet Commonly known as:  ADVIL,MOTRIN Take 1 tablet (600 mg total) every 6 (six) hours as needed by mouth.   ondansetron 4 MG tablet Commonly known as:  ZOFRAN Take 4 mg by mouth every 8 (eight) hours as needed for nausea or vomiting.   prenatal multivitamin Tabs tablet Take 1 tablet daily at 12 noon by mouth.  Diet: routine diet  Activity: Advance as tolerated. Pelvic rest for 6 weeks.   Outpatient follow up:6 weeks Follow up Appt:No future appointments. Follow up Visit:No Follow-up on file.  Postpartum contraception: Nexplanon  Newborn Data: Live born female  Birth Weight: 7 lb 15.9 oz (3626 g) APGAR: 8, 9  Newborn Delivery   Birth date/time:  11/07/2017 23:55:00 Delivery type:  Vaginal, Spontaneous     Baby Feeding: Breast Disposition:home with mother   11/09/2017 Sherian ReinJody Bovard-Stuckert, MD

## 2017-11-10 ENCOUNTER — Inpatient Hospital Stay (HOSPITAL_COMMUNITY): Payer: Medicaid Other

## 2017-12-10 ENCOUNTER — Encounter (HOSPITAL_COMMUNITY): Payer: Self-pay

## 2017-12-10 ENCOUNTER — Inpatient Hospital Stay (HOSPITAL_COMMUNITY)
Admission: AD | Admit: 2017-12-10 | Discharge: 2017-12-11 | Disposition: A | Discharge status: Home / Self Care | Payer: Medicaid Other | Source: Ambulatory Visit | Attending: Obstetrics and Gynecology | Admitting: Obstetrics and Gynecology

## 2017-12-10 DIAGNOSIS — O99335 Smoking (tobacco) complicating the puerperium: Secondary | ICD-10-CM | POA: Insufficient documentation

## 2017-12-10 DIAGNOSIS — N939 Abnormal uterine and vaginal bleeding, unspecified: Secondary | ICD-10-CM | POA: Diagnosis not present

## 2017-12-10 DIAGNOSIS — F17210 Nicotine dependence, cigarettes, uncomplicated: Secondary | ICD-10-CM | POA: Insufficient documentation

## 2017-12-10 LAB — CBC
HCT: 29.1 % — ABNORMAL LOW (ref 36.0–46.0)
HEMOGLOBIN: 9.3 g/dL — AB (ref 12.0–15.0)
MCH: 28.5 pg (ref 26.0–34.0)
MCHC: 32 g/dL (ref 30.0–36.0)
MCV: 89.3 fL (ref 78.0–100.0)
Platelets: 192 10*3/uL (ref 150–400)
RBC: 3.26 MIL/uL — AB (ref 3.87–5.11)
RDW: 13.3 % (ref 11.5–15.5)
WBC: 7.4 10*3/uL (ref 4.0–10.5)

## 2017-12-10 NOTE — MAU Note (Signed)
Patient started seeing large clots morning and bleeding this morning.   She delivered Nov 14th. She is also dizzy, light headed and has "tunnel vision" when standing.

## 2017-12-10 NOTE — MAU Note (Signed)
Correction: Patient began passing clots and bleeding 12/16.

## 2017-12-10 NOTE — MAU Provider Note (Cosign Needed)
History     CSN: [id number]  Arrival date and time: 12/10/17 2247   None     Chief Complaint  Patient presents with  . Vaginal Bleeding  . Near Syncope   21 yo G2P2002 that is 4 weeks postpartum following uncomplicated vaginal delivery is here for vaginal bleeding x 2 days. Patient says her postpartum course has been uncomplicated and she's had light vaginal bleeding since delivery. She stopped breast feeding 2 weeks ago. She has not been sexually active since delivery and does not use birth control at this time. Patient noted too many clots to count and "more bleeding than ever seen" yesterday. Today, she endorses feeling lightheaded and having tunnel vision. She was seen at [facility] this evening. Hgb was 10.9 at that time and bedside US was performed.      Past Medical History:  Diagnosis Date  . Asthma   . PCOS (polycystic ovarian syndrome)     Past Surgical History:  Procedure Laterality Date  . CYSTECTOMY     from neck  . NO PAST SURGERIES      No family history on file.  Social History   Tobacco Use  . Smoking status: Current Some Day Smoker    Packs/day: 0.25    Types: Cigarettes  . Smokeless tobacco: Never Used  Substance Use Topics  . Alcohol use: No  . Drug use: No    Allergies: No Known Allergies  Medications Prior to Admission  Medication Sig Dispense Refill Last Dose  . acetaminophen (TYLENOL) 500 MG tablet Take 1,000 mg every 6 (six) hours as needed by mouth for moderate pain.   11/06/2017 at Unknown time  . ibuprofen (ADVIL,MOTRIN) 600 MG tablet Take 1 tablet (600 mg total) every 6 (six) hours as needed by mouth. 45 tablet 1   . ondansetron (ZOFRAN) 4 MG tablet Take 4 mg by mouth every 8 (eight) hours as needed for nausea or vomiting.   11/06/2017 at Unknown time  . Prenatal Vit-Fe Fumarate-FA (PRENATAL MULTIVITAMIN) TABS tablet Take 1 tablet daily at 12 noon by mouth. 100 tablet 3     Review of Systems  Constitutional: Negative for  activity change and appetite change.  HENT: Negative for congestion and dental problem.   Eyes: Negative for discharge and itching.  Respiratory: Negative for apnea and chest tightness.   Cardiovascular: Negative for chest pain and leg swelling.  Gastrointestinal: Negative for abdominal pain and vomiting.  Endocrine: Negative for cold intolerance and heat intolerance.  Genitourinary: Positive for vaginal bleeding.  Musculoskeletal: Negative for arthralgias and back pain.  Neurological: Positive for dizziness and light-headedness. Negative for syncope.  Hematological: Negative for adenopathy. Does not bruise/bleed easily.   Physical Exam   Blood pressure 114/76, pulse (!) 107, temperature 98.1 F (36.7 C), temperature source Oral, resp. rate 16, height 5\' 3"  (1.6 m), last menstrual period 02/08/2017, unknown if currently breastfeeding.  Physical Exam  Constitutional: She is oriented to person, place, and time. She appears well-developed and well-nourished.  HENT:  Head: Normocephalic and atraumatic.  Eyes: Conjunctivae are normal. Pupils are equal, round, and reactive to light.  Neck: Normal range of motion. Neck supple.  Cardiovascular: Intact distal pulses.  Respiratory: Effort normal. No respiratory distress.  GI: Soft. There is no tenderness. There is no rebound and no guarding.  Uterine fundus palpated barely above pubic symphisis  Genitourinary:  Genitourinary Comments: Sterile speculum: blood in vaginal vault. Cleared 2 dime-sized clots from cervical os and no bleeding followed.No active  bleeding.  Musculoskeletal: Normal range of motion. She exhibits no edema.  Neurological: She is alert and oriented to person, place, and time.  Skin: Skin is warm and dry.  Psychiatric: She has a normal mood and affect. Her behavior is normal.    MAU Course  Procedures  MDM Discussed case with Dr. Mindi SlickerBanga. Will repeat cbc and Dr. Mindi SlickerBanga to see patient. Repeat hgb was 9.3.   Assessment and  Plan  1. Vaginal bleeding- evaluated by Dr. Mindi SlickerBanga. Discharge home in stable condition. Keep followup appointment for tomorrow.  [employer] Jeniel Slauson 12/10/2017, 10:55 PM

## 2017-12-11 DIAGNOSIS — N939 Abnormal uterine and vaginal bleeding, unspecified: Secondary | ICD-10-CM | POA: Diagnosis not present

## 2017-12-11 NOTE — MAU Provider Note (Signed)
Pt is a 21yo who presented via carelink from [facility]. Pt is 4 weeks postpartum from an uncomplicated vaginal delivery. She stopped pumping two weeks ago. She reports bleeding had slowed to a light brown discharge then yesterday begun passing large baseball sized clots. Pt reports passing "hundreds" of clots. She describes feeling lightheaded and dizzy. No fever, chills. [facility] reports clots in vault and at os and bedside US showing debris in uterus. Pt admits to hx of heavy irregular menses prior to pregnancy due to pcos. Has never been on medication for this.   VSS - most recent 110/67, 98 ABD - fundus firm and approx 10cm, nontender EXT - no homans GU - scant trickle of blood in vault; os closed-ft, no odor, no        lacerations of vaginal or cervix  7.4>9.3<192  A/P: 16XW 21 G2P2002 4weeks postpartum with heavy bleeding likely due to first menses post delivery; hx PCOS Advice keep appt in office on 12/19 - likely US to follow up [facility]; appt 12/26 Discharge to home with bleeding precautions

## 2017-12-11 NOTE — Discharge Instructions (Signed)
Abnormal Uterine Bleeding Abnormal uterine bleeding can affect women at various stages in life, including teenagers, women in their reproductive years, pregnant women, and women who have reached menopause. Several kinds of uterine bleeding are considered abnormal, including:  Bleeding or spotting between periods.  Bleeding after sexual intercourse.  Bleeding that is heavier or more than normal.  Periods that last longer than usual.  Bleeding after menopause. Many cases of abnormal uterine bleeding are minor and simple to treat, while others are more serious. Any type of abnormal bleeding should be evaluated by your health care provider. Treatment will depend on the cause of the bleeding. Follow these instructions at home: Monitor your condition for any changes. The following actions may help to alleviate any discomfort you are experiencing:  Avoid the use of tampons and douches as directed by your health care provider.  Change your pads frequently. You should get regular pelvic exams and Pap tests. Keep all follow-up appointments for diagnostic tests as directed by your health care provider. Contact a health care provider if:  Your bleeding lasts more than 1 week.  You feel dizzy at times. Get help right away if:  You pass out.  You are changing pads every 15 to 30 minutes.  You have abdominal pain.  You have a fever.  You become sweaty or weak.  You are passing large blood clots from the vagina.  You start to feel nauseous and vomit. This information is not intended to replace advice given to you by your health care provider. Make sure you discuss any questions you have with your health care provider. Document Released: 12/11/2005 Document Revised: 05/24/2016 Document Reviewed: 07/10/2013 Elsevier Interactive Patient Education  2017 [employer]  

## 2017-12-12 ENCOUNTER — Other Ambulatory Visit: Payer: Self-pay

## 2017-12-12 ENCOUNTER — Encounter (HOSPITAL_BASED_OUTPATIENT_CLINIC_OR_DEPARTMENT_OTHER): Payer: Self-pay | Admitting: *Deleted

## 2017-12-12 NOTE — Progress Notes (Signed)
SPOKE W/ PT  VIA PHONE FOR PRE-OP INTERVIEW.  NPO AFTER MN.  ARRIVE AT 0600. PRE-ORDERS PENDING, CASE JUST POSTED.  CURRENT CBC IN CHART AND Epic.

## 2017-12-12 NOTE — H&P (Signed)
Andrea Cooke is an 21 y.o. female G2P2 s/p NSVD 11/07/17 which was uneventful and had a normal postpartum course until 12/10/17 when began to have increasingly heavy bleeding.  [facility] and was [facility] 12[facility]11/17 with evaluation but by that point bleeding had stopped.for most part.  She hada f/u US 12/12/17 and the endometriium was 2.9 cm and heterogeneous with possible retained POC's,  We discussed options and patient would like to proceed with D&C.  Pertinent Gynecological History:  OB History: NSVD x 2   Menstrual History:  Patient's last menstrual period was 02/08/2017.    Past Medical History:  Diagnosis Date  . Cold    recent congestion taking mucinex,  no fever   . Episode of heavy vaginal bleeding    postportum svd 11-08-2017  . History of asthma    CHILD  . Light headedness   . PCOS (polycystic ovarian syndrome)   . Retained products of conception, postpartum    SVD 11-08-2017    Past Surgical History:  Procedure Laterality Date  . CYST REMOVAL FROM NECK, BENIGN  01/2017    History reviewed. No pertinent family history.  Social History:  reports that she has been smoking cigarettes.  She has a 1.00 pack-year smoking history. she has never used smokeless tobacco. She reports that she does not drink alcohol or use drugs.  Allergies: No Known Allergies  No medications prior to admission.    Review of Systems  Gastrointestinal: Positive for abdominal pain.  Genitourinary: Negative for hematuria.    Height 5\' 3"  (1.6 m), weight 90.7 kg (200 lb), last menstrual period 02/08/2017, not currently breastfeeding. Physical Exam  Constitutional: She appears well-developed.  Cardiovascular: Normal rate and regular rhythm.  Respiratory: Effort normal.  GI: Soft.  Neurological: She is alert.  Psychiatric: She has a normal mood and affect.    No results found for this or any previous visit (from the past 24 hour(s)).  No  results found.  Assessment/Plan: We discussed risks and benefits of D&C and using cytotec 200mg  prior to procedure to help with reducing risk of uterine perforation. Pt desires to proceed.   Oliver PilaKathy W Delise Simenson 12/12/2017, 5:22 PM

## 2017-12-13 NOTE — Anesthesia Preprocedure Evaluation (Signed)
Anesthesia Evaluation  Patient identified by MRN, date of birth, ID band Patient awake    Reviewed: Allergy & Precautions, NPO status , Patient's Chart, lab work & pertinent test results  History of Anesthesia Complications (+) PONV  Airway Mallampati: II  TM Distance: >3 FB Neck ROM: Full    Dental no notable dental hx.    Pulmonary asthma , Current Smoker,    Pulmonary exam normal breath sounds clear to auscultation       Cardiovascular negative cardio ROS Normal cardiovascular exam Rhythm:Regular Rate:Normal     Neuro/Psych negative neurological ROS  negative psych ROS   GI/Hepatic negative GI ROS, Neg liver ROS,   Endo/Other  Morbid obesity  Renal/GU negative Renal ROS     Musculoskeletal negative musculoskeletal ROS (+)   Abdominal   Peds  Hematology negative hematology ROS (+)   Anesthesia Other Findings PCOS (polycystic ovarian syndrome)    Reproductive/Obstetrics (+) Pregnancy                             Anesthesia Physical  Anesthesia Plan  ASA: II  Anesthesia Plan: General   Post-op Pain Management:    Induction: Intravenous  PONV Risk Score and Plan: 4 or greater and Ondansetron, Treatment may vary due to age or medical condition, Dexamethasone, Midazolam and Scopolamine patch - Pre-op  Airway Management Planned: LMA and Oral ETT  Additional Equipment:   Intra-op Plan:   Post-operative Plan:   Informed Consent: I have reviewed the patients History and Physical, chart, labs and discussed the procedure including the risks, benefits and alternatives for the proposed anesthesia with the patient or authorized representative who has indicated his/her understanding and acceptance.     Plan Discussed with: CRNA and Surgeon  Anesthesia Plan Comments: ( )        Anesthesia Quick Evaluation

## 2017-12-14 ENCOUNTER — Ambulatory Visit (HOSPITAL_COMMUNITY)
Admission: RE | Admit: 2017-12-14 | Discharge: 2017-12-14 | Disposition: A | Discharge status: Home / Self Care | Payer: Medicaid Other | Source: Ambulatory Visit | Attending: Obstetrics and Gynecology | Admitting: Obstetrics and Gynecology

## 2017-12-14 ENCOUNTER — Ambulatory Visit (HOSPITAL_BASED_OUTPATIENT_CLINIC_OR_DEPARTMENT_OTHER): Payer: Medicaid Other | Admitting: Anesthesiology

## 2017-12-14 ENCOUNTER — Encounter (HOSPITAL_BASED_OUTPATIENT_CLINIC_OR_DEPARTMENT_OTHER): Payer: Self-pay

## 2017-12-14 ENCOUNTER — Encounter (HOSPITAL_BASED_OUTPATIENT_CLINIC_OR_DEPARTMENT_OTHER)
Admission: RE | Disposition: A | Discharge status: Home / Self Care | Payer: Self-pay | Source: Ambulatory Visit | Attending: Obstetrics and Gynecology

## 2017-12-14 DIAGNOSIS — O99215 Obesity complicating the puerperium: Secondary | ICD-10-CM | POA: Diagnosis not present

## 2017-12-14 DIAGNOSIS — O99335 Smoking (tobacco) complicating the puerperium: Secondary | ICD-10-CM | POA: Diagnosis not present

## 2017-12-14 DIAGNOSIS — F17210 Nicotine dependence, cigarettes, uncomplicated: Secondary | ICD-10-CM | POA: Insufficient documentation

## 2017-12-14 HISTORY — PX: DILATION AND EVACUATION: SHX1459

## 2017-12-14 HISTORY — DX: Reserved for inherently not codable concepts without codable children: IMO0001

## 2017-12-14 HISTORY — DX: Abnormal uterine and vaginal bleeding, unspecified: N93.9

## 2017-12-14 HISTORY — DX: Dizziness and giddiness: R42

## 2017-12-14 HISTORY — DX: Personal history of other diseases of the respiratory system: Z87.09

## 2017-12-14 LAB — TYPE AND SCREEN
ABO/RH(D): O POS
Antibody Screen: NEGATIVE

## 2017-12-14 LAB — CBC
HCT: 26.6 % — ABNORMAL LOW (ref 36.0–46.0)
Hemoglobin: 8.6 g/dL — ABNORMAL LOW (ref 12.0–15.0)
MCH: 28.3 pg (ref 26.0–34.0)
MCHC: 32.3 g/dL (ref 30.0–36.0)
MCV: 87.5 fL (ref 78.0–100.0)
PLATELETS: 245 10*3/uL (ref 150–400)
RBC: 3.04 MIL/uL — ABNORMAL LOW (ref 3.87–5.11)
RDW: 13.9 % (ref 11.5–15.5)
WBC: 6.4 10*3/uL (ref 4.0–10.5)

## 2017-12-14 LAB — ABO/RH: ABO/RH(D): O POS

## 2017-12-14 SURGERY — DILATION AND EVACUATION, UTERUS
Anesthesia: General

## 2017-12-14 MED ORDER — ACETAMINOPHEN 160 MG/5ML PO SOLN
325.0000 mg | ORAL | Status: DC | PRN
  Filled 2017-12-14: qty 20.3

## 2017-12-14 MED ORDER — OXYCODONE HCL 5 MG/5ML PO SOLN
5.0000 mg | Freq: Once | ORAL | Status: DC | PRN
  Filled 2017-12-14: qty 5

## 2017-12-14 MED ORDER — FENTANYL CITRATE (PF) 100 MCG/2ML IJ SOLN
INTRAMUSCULAR | Status: DC | PRN
  Administered 2017-12-14 (×2): 50 ug via INTRAVENOUS

## 2017-12-14 MED ORDER — PROPOFOL 10 MG/ML IV BOLUS
INTRAVENOUS | Status: AC
  Filled 2017-12-14: qty 40

## 2017-12-14 MED ORDER — MEPERIDINE HCL 25 MG/ML IJ SOLN
6.2500 mg | INTRAMUSCULAR | Status: DC | PRN
  Filled 2017-12-14: qty 1

## 2017-12-14 MED ORDER — ONDANSETRON HCL 4 MG/2ML IJ SOLN
INTRAMUSCULAR | Status: AC
  Filled 2017-12-14: qty 2

## 2017-12-14 MED ORDER — LACTATED RINGERS IV SOLN
INTRAVENOUS | Status: DC
  Filled 2017-12-14: qty 1000

## 2017-12-14 MED ORDER — FENTANYL CITRATE (PF) 100 MCG/2ML IJ SOLN
INTRAMUSCULAR | Status: AC
  Filled 2017-12-14: qty 2

## 2017-12-14 MED ORDER — ACETAMINOPHEN 325 MG PO TABS
325.0000 mg | ORAL_TABLET | ORAL | Status: DC | PRN
  Filled 2017-12-14: qty 2

## 2017-12-14 MED ORDER — ONDANSETRON HCL 4 MG/2ML IJ SOLN
INTRAMUSCULAR | Status: DC | PRN
  Administered 2017-12-14: 4 mg via INTRAVENOUS

## 2017-12-14 MED ORDER — MIDAZOLAM HCL 2 MG/2ML IJ SOLN
INTRAMUSCULAR | Status: AC
  Filled 2017-12-14: qty 2

## 2017-12-14 MED ORDER — LIDOCAINE HCL 1 % IJ SOLN
INTRAMUSCULAR | Status: DC | PRN
  Administered 2017-12-14: 20 mL

## 2017-12-14 MED ORDER — LACTATED RINGERS IV SOLN
INTRAVENOUS | Status: DC
  Administered 2017-12-14: 1000 mL via INTRAVENOUS
  Filled 2017-12-14: qty 1000

## 2017-12-14 MED ORDER — PROPOFOL 10 MG/ML IV BOLUS
INTRAVENOUS | Status: DC | PRN
  Administered 2017-12-14: 200 mg via INTRAVENOUS

## 2017-12-14 MED ORDER — OXYCODONE HCL 5 MG PO TABS
5.0000 mg | ORAL_TABLET | Freq: Once | ORAL | Status: DC | PRN
  Filled 2017-12-14: qty 1

## 2017-12-14 MED ORDER — LIDOCAINE 2% (20 MG/ML) 5 ML SYRINGE
INTRAMUSCULAR | Status: DC | PRN
  Administered 2017-12-14: 100 mg via INTRAVENOUS

## 2017-12-14 MED ORDER — LIDOCAINE 2% (20 MG/ML) 5 ML SYRINGE
INTRAMUSCULAR | Status: AC
  Filled 2017-12-14: qty 5

## 2017-12-14 MED ORDER — MIDAZOLAM HCL 5 MG/5ML IJ SOLN
INTRAMUSCULAR | Status: DC | PRN
  Administered 2017-12-14: 2 mg via INTRAVENOUS

## 2017-12-14 MED ORDER — FENTANYL CITRATE (PF) 100 MCG/2ML IJ SOLN
25.0000 ug | INTRAMUSCULAR | Status: DC | PRN
  Filled 2017-12-14: qty 1

## 2017-12-14 MED ORDER — KETOROLAC TROMETHAMINE 30 MG/ML IJ SOLN
INTRAMUSCULAR | Status: AC
  Filled 2017-12-14: qty 1

## 2017-12-14 MED ORDER — KETOROLAC TROMETHAMINE 30 MG/ML IJ SOLN
30.0000 mg | Freq: Once | INTRAMUSCULAR | Status: DC | PRN
Start: 2017-12-14 — End: 2017-12-14
  Filled 2017-12-14: qty 1

## 2017-12-14 MED ORDER — DEXAMETHASONE SODIUM PHOSPHATE 10 MG/ML IJ SOLN
INTRAMUSCULAR | Status: DC | PRN
  Administered 2017-12-14: 10 mg via INTRAVENOUS

## 2017-12-14 MED ORDER — SODIUM CHLORIDE 0.9 % IR SOLN
Status: DC | PRN
  Administered 2017-12-14: 500 mL

## 2017-12-14 MED ORDER — ONDANSETRON HCL 4 MG/2ML IJ SOLN
4.0000 mg | Freq: Once | INTRAMUSCULAR | Status: DC | PRN
Start: 2017-12-14 — End: 2017-12-14
  Filled 2017-12-14: qty 2

## 2017-12-14 MED ORDER — KETOROLAC TROMETHAMINE 30 MG/ML IJ SOLN
INTRAMUSCULAR | Status: DC | PRN
  Administered 2017-12-14: 30 mg via INTRAVENOUS

## 2017-12-14 MED ORDER — DEXAMETHASONE SODIUM PHOSPHATE 10 MG/ML IJ SOLN
INTRAMUSCULAR | Status: AC
  Filled 2017-12-14: qty 1

## 2017-12-14 SURGICAL SUPPLY — 18 items
CATH ROBINSON RED A/P 16FR (CATHETERS) ×3 IMPLANT
DECANTER SPIKE VIAL GLASS SM (MISCELLANEOUS) ×3 IMPLANT
GLOVE BIO SURGEON STRL SZ 6.5 (GLOVE) ×2 IMPLANT
GLOVE BIO SURGEONS STRL SZ 6.5 (GLOVE) ×1
GLOVE BIOGEL PI IND STRL 7.0 (GLOVE) ×1 IMPLANT
GLOVE BIOGEL PI INDICATOR 7.0 (GLOVE) ×2
GOWN STRL REUS W/TWL LRG LVL3 (GOWN DISPOSABLE) ×6 IMPLANT
KIT BERKELEY 1ST TRIMESTER 3/8 (MISCELLANEOUS) ×3 IMPLANT
NS IRRIG 1000ML POUR BTL (IV SOLUTION) ×3 IMPLANT
PACK VAGINAL MINOR WOMEN LF (CUSTOM PROCEDURE TRAY) ×3 IMPLANT
PAD OB MATERNITY 4.3X12.25 (PERSONAL CARE ITEMS) ×3 IMPLANT
PAD PREP 24X48 CUFFED NSTRL (MISCELLANEOUS) ×3 IMPLANT
SET BERKELEY SUCTION TUBING (SUCTIONS) ×3 IMPLANT
TOWEL OR 17X24 6PK STRL BLUE (TOWEL DISPOSABLE) ×6 IMPLANT
VACURETTE 10 RIGID CVD (CANNULA) IMPLANT
VACURETTE 7MM CVD STRL WRAP (CANNULA) ×2 IMPLANT
VACURETTE 8 RIGID CVD (CANNULA) IMPLANT
VACURETTE 9 RIGID CVD (CANNULA) ×2 IMPLANT

## 2017-12-14 NOTE — Transfer of Care (Signed)
Immediate Anesthesia Transfer of Care Note  Patient: Andrea Cooke  Procedure(s) Performed: DILATATION AND EVACUATION (N/A )  Patient Location: PACU  Anesthesia Type:General  Level of Consciousness: awake, alert  and oriented  Airway & Oxygen Therapy: Patient Spontanous Breathing and Patient connected to nasal cannula oxygen  Post-op Assessment: Report given to RN  Post vital signs: Reviewed and stable  Last Vitals: 112/74 Vitals:   12/14/17 0621 12/14/17 0829  BP: 130/74   Pulse: 100 85  Resp: 16   Temp: 36.9 C 36.5 C  SpO2: 100% 100%    Last Pain:  Vitals:   12/14/17 0645  TempSrc:   PainSc: 0-No pain      Patients Stated Pain Goal: 7 (12/14/17 0645)  Complications: No apparent anesthesia complications

## 2017-12-14 NOTE — Discharge Instructions (Signed)
°  Post Anesthesia Home Care Instructions ° °Activity: °Get plenty of rest for the remainder of the day. A responsible individual must stay with you for 24 hours following the procedure.  °For the next 24 hours, DO NOT: °-Drive a car °-Operate machinery °-Drink alcoholic beverages °-Take any medication unless instructed by your physician °-Make any legal decisions or sign important papers. ° °Meals: °Start with liquid foods such as gelatin or soup. Progress to regular foods as tolerated. Avoid greasy, spicy, heavy foods. If nausea and/or vomiting occur, drink only clear liquids until the nausea and/or vomiting subsides. Call your physician if vomiting continues. ° °Special Instructions/Symptoms: °Your throat may feel dry or sore from the anesthesia or the breathing tube placed in your throat during surgery. If this causes discomfort, gargle with warm salt water. The discomfort should disappear within 24 hours. ° °If you had a scopolamine patch placed behind your ear for the management of post- operative nausea and/or vomiting: ° °1. The medication in the patch is effective for 72 hours, after which it should be removed.  Wrap patch in a tissue and discard in the trash. Wash hands thoroughly with soap and water. °2. You may remove the patch earlier than 72 hours if you experience unpleasant side effects which may include dry mouth, dizziness or visual disturbances. °3. Avoid touching the patch. Wash your hands with soap and water after contact with the patch. °    ° °D & C Home care Instructions: ° ° °Personal hygiene:  Used sanitary napkins for vaginal drainage not tampons. Shower or tub bathe the day after your procedure. No douching until bleeding stops. Always wipe from front to back after  Elimination. ° °Activity: Do not drive or operate any equipment today. The effects of the anesthesia are still present and drowsiness may result. Rest today, not necessarily flat bed rest, just take it easy. You may resume your  normal activity in one to 2 days. ° °Sexual activity: No intercourse for one week or as indicated by your physician ° °Diet: Eat a light diet as desired this evening. You may resume a regular diet tomorrow. ° °Return to work: One to 2 days. ° °General Expectations of your surgery: Vaginal bleeding should be no heavier than a normal period. Spotting may continue up to 10 days. Mild cramps may continue for a couple of days. You may have a regular period in 2-6 weeks. ° °Unexpected observations call your doctor if these occur: persistent or heavy bleeding. Severe abdominal cramping or pain. Elevation of temperature greater than 100°F. ° °Call for an appointment in one week. ° ° ° °Patient's Signature_______________________________________________________ ° °Nurse's Signature________________________________________________________  °

## 2017-12-14 NOTE — Progress Notes (Signed)
Patient ID: Andrea Cooke, female   DOB: 1996/07/13, 21 y.o.   MRN: [id number] Per patient no changes in dictated H&P except had an episode of cramping and bleeding last night, does not think she passed tissue but did pass some clots.  Hgb. 8.6 Brief exam WNL.

## 2017-12-14 NOTE — Anesthesia Procedure Notes (Signed)
Procedure Name: LMA Insertion Date/Time: 12/14/2017 7:51 AM Performed by: Briant Sitesenenny, Yoshie Kosel T, CRNA Pre-anesthesia Checklist: Patient identified, Emergency Drugs available, Suction available and Patient being monitored Patient Re-evaluated:Patient Re-evaluated prior to induction Oxygen Delivery Method: Circle system utilized Preoxygenation: Pre-oxygenation with 100% oxygen Induction Type: IV induction Ventilation: Mask ventilation without difficulty LMA: LMA inserted LMA Size: 4.0 Number of attempts: 1 Airway Equipment and Method: Bite block Placement Confirmation: positive ETCO2 Tube secured with: Tape Dental Injury: Teeth and Oropharynx as per pre-operative assessment

## 2017-12-14 NOTE — Anesthesia Postprocedure Evaluation (Signed)
Anesthesia Post Note  Patient: Breeann N Veazey  Procedure(s) Performed: DILATATION AND EVACUATION (N/A )     Patient location during evaluation: PACU Anesthesia Type: General Level of consciousness: awake and alert Pain management: pain level controlled Vital Signs Assessment: post-procedure vital signs reviewed and stable Respiratory status: spontaneous breathing, nonlabored ventilation, respiratory function stable and patient connected to nasal cannula oxygen Cardiovascular status: blood pressure returned to baseline and stable Postop Assessment: no apparent nausea or vomiting Anesthetic complications: no    Last Vitals:  Vitals:   12/14/17 0829 12/14/17 0830  BP:  111/71  Pulse: 85 78  Resp:  17  Temp: 36.5 C   SpO2: 100% 100%    Last Pain:  Vitals:   12/14/17 0645  TempSrc:   PainSc: 0-No pain                 Janeli Lewison

## 2017-12-14 NOTE — Op Note (Signed)
Operative Note    Preoperative Diagnosis S/p NSVD with suspected retained products of conception  Postoperative Diagnosis same  Procedure Suction dilation and curettage  Surgeon Huel CoteKathy Mariely Mahr, MD  Anesthesia LMA and 1% lidocaine block  Fluids: EBL 100mL UOP 100mL prior to procedure IVF 800mL  Findings Uterus slightly enlarged, cervix dilated to 1-2 cm with clot extruding from os  Specimen POC's   Procedure Note Pt was taken to operating room where LMA anesthesia was obtained without difficulty.  She was prepped and draped in the normal sterile fashion in the dorsal lithotomy position.  An appropriate timeout was performed.  A speculum was placed in the vagina and 2cc of 1% plain lidocaine injected into the anterior cervix.  An additional 9cc was placed both at 2 and 10 o'clock for a paracervical block.  A single tooth tenaculum was placed on the anterior lip of the cervix. The cervix was slightly dilated from the pt's cytotec treatment and  sound was easily introduced into the uterus and the uterus sounded to about 8cm.  The 7 mm suction currette was obtained and introduced into the fundus.  With several passes, a moderate amount of tissue was obtained.  When no further tissue was noted, the suction was discontinued and a gentle currettage performed. An additional pass with a 9mm curette was performed as the tissue was somewhat adherent and when the passes revealed no further tissue, and the procedure was completed.  The tenaculum was removed and the site hemostatic.  All instruments and sponges were removed from vagina and the patient awakened and taken to the PACU in good condition.

## 2017-12-19 ENCOUNTER — Encounter (HOSPITAL_BASED_OUTPATIENT_CLINIC_OR_DEPARTMENT_OTHER): Payer: Self-pay | Admitting: Obstetrics and Gynecology

## 2023-05-23 DIAGNOSIS — S32009A Unspecified fracture of unspecified lumbar vertebra, initial encounter for closed fracture: Secondary | ICD-10-CM | POA: Insufficient documentation

## 2023-05-23 DIAGNOSIS — G8911 Acute pain due to trauma: Secondary | ICD-10-CM | POA: Insufficient documentation

## 2023-05-23 DIAGNOSIS — J939 Pneumothorax, unspecified: Secondary | ICD-10-CM | POA: Insufficient documentation

## 2023-05-23 DIAGNOSIS — S73015A Posterior dislocation of left hip, initial encounter: Secondary | ICD-10-CM | POA: Insufficient documentation

## 2023-05-25 DIAGNOSIS — S7011XA Contusion of right thigh, initial encounter: Secondary | ICD-10-CM | POA: Insufficient documentation

## 2023-05-31 DIAGNOSIS — T148XXA Other injury of unspecified body region, initial encounter: Secondary | ICD-10-CM | POA: Insufficient documentation
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
## 2024-11-18 ENCOUNTER — Telehealth: Admitting: Physician Assistant

## 2024-11-18 ENCOUNTER — Telehealth

## 2024-11-18 DIAGNOSIS — R2241 Localized swelling, mass and lump, right lower limb: Secondary | ICD-10-CM

## 2024-11-19 ENCOUNTER — Encounter: Payer: Self-pay | Admitting: Nurse Practitioner

## 2024-11-24 ENCOUNTER — Encounter: Payer: Self-pay | Admitting: Nurse Practitioner

## 2024-11-24 ENCOUNTER — Telehealth: Admitting: Nurse Practitioner

## 2024-11-24 VITALS — BP 119/81 | HR 75 | Resp 16 | Ht 64.57 in | Wt 162.4 lb

## 2024-11-24 DIAGNOSIS — T148XXA Other injury of unspecified body region, initial encounter: Secondary | ICD-10-CM | POA: Diagnosis not present

## 2024-11-24 DIAGNOSIS — F3110 Bipolar disorder, current episode manic without psychotic features, unspecified: Secondary | ICD-10-CM | POA: Diagnosis not present

## 2024-11-24 DIAGNOSIS — R2241 Localized swelling, mass and lump, right lower limb: Secondary | ICD-10-CM

## 2024-11-24 DIAGNOSIS — E041 Nontoxic single thyroid nodule: Secondary | ICD-10-CM

## 2024-11-24 DIAGNOSIS — Z975 Presence of (intrauterine) contraceptive device: Secondary | ICD-10-CM

## 2024-11-24 DIAGNOSIS — F1911 Other psychoactive substance abuse, in remission: Secondary | ICD-10-CM

## 2024-11-24 MED ORDER — LAMOTRIGINE 25 MG PO TABS: 50.0000 mg | ORAL_TABLET | Freq: Every day | ORAL | 0 refills | Status: AC

## 2024-11-24 MED ORDER — TRAZODONE HCL 100 MG PO TABS: 100.0000 mg | ORAL_TABLET | Freq: Every evening | ORAL | 0 refills | Status: AC | PRN
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
## 2024-11-24 NOTE — Progress Notes (Signed)
 Pt presents to establish care -request labs -needs referrals for thyroid nodule and mass on right leg  -needs refill on Lamictal and Trazodone

## 2024-11-24 NOTE — Patient Instructions (Addendum)
   Thyroid ultrasound scheduling  [address] [place], [place] [address]  Phone [phone]   Referral also placed to behavioral health for counseling and general surgery for evaluation of right leg mass

## 2024-11-25 ENCOUNTER — Ambulatory Visit: Payer: Self-pay | Admitting: Nurse Practitioner

## 2024-11-25 LAB — CBC WITH DIFFERENTIAL/PLATELET
Basophils Absolute: 0.1 x10E3/uL (ref 0.0–0.2)
Basos: 1 %
EOS (ABSOLUTE): 0.2 x10E3/uL (ref 0.0–0.4)
Eos: 3 %
Hematocrit: 43 % (ref 34.0–46.6)
Hemoglobin: 14.2 g/dL (ref 11.1–15.9)
Immature Grans (Abs): 0.1 x10E3/uL (ref 0.0–0.1)
Immature Granulocytes: 1 %
Lymphocytes Absolute: 1.7 x10E3/uL (ref 0.7–3.1)
Lymphs: 30 %
MCH: 30.7 pg (ref 26.6–33.0)
MCHC: 33 g/dL (ref 31.5–35.7)
MCV: 93 fL (ref 79–97)
Monocytes Absolute: 0.4 x10E3/uL (ref 0.1–0.9)
Monocytes: 7 %
Neutrophils Absolute: 3.2 x10E3/uL (ref 1.4–7.0)
Neutrophils: 58 %
Platelets: 195 x10E3/uL (ref 150–450)
RBC: 4.63 x10E6/uL (ref 3.77–5.28)
RDW: 12 % (ref 11.7–15.4)
WBC: 5.5 x10E3/uL (ref 3.4–10.8)

## 2024-11-25 LAB — COMPREHENSIVE METABOLIC PANEL WITH GFR
ALT: 23 IU/L (ref 0–32)
AST: 20 IU/L (ref 0–40)
Albumin: 4.7 g/dL (ref 4.0–5.0)
Alkaline Phosphatase: 61 IU/L (ref 41–116)
BUN/Creatinine Ratio: 15 (ref 9–23)
BUN: 13 mg/dL (ref 6–20)
Bilirubin Total: 0.6 mg/dL (ref 0.0–1.2)
CO2: 24 mmol/L (ref 20–29)
Calcium: 9.1 mg/dL (ref 8.7–10.2)
Chloride: 100 mmol/L (ref 96–106)
Creatinine, Ser: 0.84 mg/dL (ref 0.57–1.00)
Globulin, Total: 2.5 g/dL (ref 1.5–4.5)
Glucose: 83 mg/dL (ref 70–99)
Potassium: 4.3 mmol/L (ref 3.5–5.2)
Sodium: 138 mmol/L (ref 134–144)
Total Protein: 7.2 g/dL (ref 6.0–8.5)
eGFR: 97 mL/min/1.73 (ref >59)

## 2024-11-25 LAB — VITAMIN D 25 HYDROXY (VIT D DEFICIENCY, FRACTURES): Vit D, 25-Hydroxy: 29 ng/mL — ABNORMAL LOW (ref 30.0–100.0)

## 2024-11-25 LAB — HEMOGLOBIN A1C
Est. average glucose Bld gHb Est-mCnc: 103 mg/dL
Hgb A1c MFr Bld: 5.2 % (ref 4.8–5.6)

## 2024-11-25 LAB — THYROID PANEL WITH TSH
Free Thyroxine Index: 2.2 (ref 1.2–4.9)
T3 Uptake Ratio: 25 % (ref 24–39)
T4, Total: 8.8 ug/dL (ref 4.5–12.0)
TSH: 1.71 u[IU]/mL (ref 0.450–4.500)

## 2024-11-25 LAB — LIPID PANEL
Chol/HDL Ratio: 2.4 ratio (ref 0.0–4.4)
Cholesterol, Total: 169 mg/dL (ref 100–199)
HDL: 69 mg/dL (ref >39)
LDL Chol Calc (NIH): 85 mg/dL (ref 0–99)
Triglycerides: 82 mg/dL (ref 0–149)
VLDL Cholesterol Cal: 15 mg/dL (ref 5–40)

## 2024-11-27 ENCOUNTER — Telehealth: Payer: Self-pay | Admitting: *Deleted

## 2024-11-27 NOTE — Progress Notes (Signed)
 Complex Care Management Note Care Guide Note  11/27/2024 Name: Andrea Cooke MRN: [id number] DOB: [birth year]   Complex Care Management Outreach Attempts: An unsuccessful telephone outreach was attempted today to offer the patient information about available complex care management services.  Follow Up Plan:  Additional outreach attempts will be made to offer the patient complex care management information and services.   Encounter Outcome:  No Answer  Thedford Franks, CMA   [facility], [facility] Guide Direct Dial: [phone]  Fax: [phone] Website: [email or web address]

## 2024-11-28 NOTE — Progress Notes (Signed)
 Complex Care Management Note  Care Guide Note 11/28/2024 Name: Andrea Cooke MRN: [id number] DOB: 21-Feb-1996  Andrea Cooke is a 28 y.o. year old female who sees Kennyth Domino, FNP for primary care. I reached out to Andrea Cooke by phone today to offer complex care management services.  [name] was given information about Complex Care Management services today including:   The Complex Care Management services include support from the care team which includes your Nurse Care Manager, Clinical Social Worker, or Pharmacist.  The Complex Care Management team is here to help remove barriers to the health concerns and goals most important to you. Complex Care Management services are voluntary, and the patient may decline or stop services at any time by request to their care team member.   Complex Care Management Consent Status: Patient agreed to services and verbal consent obtained.   Follow up plan:  Telephone appointment with complex care management team member scheduled for:  12/09/2024  Encounter Outcome:  Patient Scheduled  Thedford Franks, CMA [place]  [facility], [facility] Guide Direct Dial: [phone]  Fax: [phone] Website: [email or web address]

## 2024-12-01 ENCOUNTER — Ambulatory Visit: Admitting: Surgery

## 2024-12-01 ENCOUNTER — Encounter: Payer: Self-pay | Admitting: Surgery

## 2024-12-01 VITALS — BP 122/80 | HR 80 | Temp 98.7°F | Ht 64.0 in | Wt 162.6 lb

## 2024-12-01 DIAGNOSIS — R2241 Localized swelling, mass and lump, right lower limb: Secondary | ICD-10-CM | POA: Diagnosis not present
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
## 2024-12-01 NOTE — Patient Instructions (Signed)
 We have scheduled you for a CT Scan of your Abdomen and Pelvis without contrast. This has been scheduled at [facility] [address] on 12/03/2024 . Please arrive there by 11:45 am . If you need to reschedule your Scan, you may do so by calling [phone]. Please let us  know if you reschedule your scan as we have to get authorization from your insurance for this.    Hematoma  A hematoma is a collection of blood. A hematoma can happen: Under the skin. In an organ. In a body space. In a joint space. In other tissues. The blood can thicken (clot) to form a lump that you can see and feel. The lump is often hard and may become sore and tender. The lump can be very small or very big. Most hematomas get better in a few days to weeks. However, some of these may be serious and need medical care. What are the causes? This condition is caused by: An injury. Blood that leaks under the skin. Problems from surgeries. Medical conditions that cause bleeding or bruising. What increases the risk? You are more likely to develop this condition if: You are an older adult. You use medicines that thin your blood. You use NSAIDs, such as ibuprofen , often for pain. You play contact sports. What are the signs or symptoms? Symptoms depend on where the hematoma is in your body. If the hematoma is under the skin, there is: A firm lump on the body. Pain and tenderness in the area. Bruising. The skin above the lump may be blue, dark blue, purple-red, or yellowish. If the hematoma is deep in the tissues or body spaces, there may be: Blood in the stomach. This may cause pain in the belly (abdomen), weakness, passing out (fainting), and shortness of breath. Blood in the head. This may cause a headache, weakness, trouble speaking or understanding speech, or passing out. How is this treated? Treatment depends on the cause, size, and location of the hematoma. Treatment may include: Doing nothing. Many  hematomas go away on their own without treatment. Surgery or close monitoring. This may be needed for large hematomas or hematomas that affect the body's organs. Medicines. These may be given if a medical condition caused the hematoma. Follow these instructions at home: Managing pain, stiffness, and swelling  If told, put ice on the injured area. To do this: Put ice in a plastic bag. Place a towel between your skin and the bag. Leave the ice on for 20 minutes, 2-3 times a day for the first two days. If your skin turns bright red, take off the ice right away to prevent skin damage. The risk of skin damage is higher if you cannot feel pain, heat, or cold. If told, put heat on the injured area. Do this as often as told by your doctor. Use the heat source that your doctor recommends, such as a moist heat pack or a heating pad. Place a towel between your skin and the heat source. Leave the heat on for 20-30 minutes. If your skin turns bright red, take off the heat right away to prevent burns. The risk of burns is higher if you cannot feel pain, heat, or cold. Raise the injured area above the level of your heart while you are sitting or lying down. Wrap the affected area with an elastic bandage, if told by your doctor. Do not wrap the bandage too tight. If your hematoma is on a leg or foot and is painful,  your doctor may give you crutches. Use them as told by your doctor. General instructions Take over-the-counter and prescription medicines only as told by your doctor. Keep all follow-up visits. Your doctor may want to see how your hematoma is healing with treatment. Contact a doctor if: You have a fever. The swelling or bruising gets worse. You start to get more hematomas. Your pain gets worse. Your pain is not getting better with medicine. The skin over the hematoma breaks or starts to bleed. Get help right away if: Your hematoma is in your chest or belly and you: Pass out. Feel  weak. Become short of breath. You have a hematoma on your scalp that is caused by a fall or injury, and you: Have a headache that gets worse. Have trouble speaking or understanding speech. Become less alert or you pass out. These symptoms may be an emergency. Get help right away. Call 911. Do not wait to see if the symptoms will go away. Do not drive yourself to the hospital This information is not intended to replace advice given to you by your health care provider. Make sure you discuss any questions you have with your health care provider. Document Revised: 06/05/2022 Document Reviewed: 06/05/2022 Elsevier Patient Education  2024 [employer].

## 2024-12-02 ENCOUNTER — Inpatient Hospital Stay: Admission: RE | Admit: 2024-12-02 | Discharge: 2024-12-02 | Attending: Nurse Practitioner

## 2024-12-02 DIAGNOSIS — E041 Nontoxic single thyroid nodule: Secondary | ICD-10-CM

## 2024-12-03 ENCOUNTER — Ambulatory Visit: Admission: RE | Admit: 2024-12-03 | Source: Ambulatory Visit

## 2024-12-09 ENCOUNTER — Other Ambulatory Visit: Payer: Self-pay | Admitting: Licensed Clinical Social Worker

## 2024-12-09 NOTE — Patient Outreach (Signed)
 LCSW called patient on the phone. LCSW introduced self and explained reason for the call. Patient states they are not interested in [facility]- complex care management at this time.   Andrea Ligas, LCSW Clinical Social Worker [facility]

## 2024-12-09 NOTE — Patient Instructions (Signed)
 Visit Information  Thank you for taking time to visit with me today. Please don't hesitate to contact me if I can be of assistance to you in the future  Please call the care guide team at [phone] if you need to cancel or reschedule your appointment.   Following is a copy of your care plan:   Goals Addressed   None     Please go to [facility] [address] [place] [phone]) call 911 if you are experiencing a Mental Health or Behavioral Health Crisis or need someone to talk to.  Patient verbalized understanding of Care plan and visit instructions communicated this visit  Cena Ligas, LCSW Clinical Social Worker [facility] [employer]

## 2024-12-10 ENCOUNTER — Ambulatory Visit: Admission: RE | Admit: 2024-12-10 | Source: Ambulatory Visit

## 2024-12-10 ENCOUNTER — Ambulatory Visit: Admitting: Surgery

## 2024-12-12 ENCOUNTER — Ambulatory Visit

## 2024-12-12 ENCOUNTER — Inpatient Hospital Stay: Admission: RE | Admit: 2024-12-12 | Discharge: 2024-12-12 | Attending: Surgery

## 2024-12-12 DIAGNOSIS — R2241 Localized swelling, mass and lump, right lower limb: Secondary | ICD-10-CM

## 2024-12-15 ENCOUNTER — Encounter: Payer: Self-pay | Admitting: Surgery

## 2024-12-15 ENCOUNTER — Ambulatory Visit: Admitting: Surgery

## 2024-12-15 VITALS — BP 124/76 | HR 83 | Temp 98.7°F | Ht 64.0 in | Wt 166.8 lb

## 2024-12-15 DIAGNOSIS — R2241 Localized swelling, mass and lump, right lower limb: Secondary | ICD-10-CM | POA: Diagnosis not present

## 2024-12-15 DIAGNOSIS — S8011XA Contusion of right lower leg, initial encounter: Secondary | ICD-10-CM

## 2024-12-15 NOTE — Progress Notes (Signed)
 Outpatient Surgical Follow Up  12/15/2024  Andrea Cooke is an 28 y.o. female.   Chief Complaint  Patient presents with   Follow-up    Hematoma right leg mass    HPI: He is following up after recent CT scan was obtained for asymmetry and soft tissue deformity on the right thigh.  I have personally reviewed the images showing no evidence of infection no evidence of hematomas or any suspicious lesions.  There is asymmetry in the right thigh. She continues to be self-conscious about this.  No fevers no chills  Past Medical History:  Diagnosis Date   Cold    recent congestion taking mucinex,  no fever    Episode of heavy vaginal bleeding    postportum svd 11-08-2017   History of asthma    CHILD   Light headedness    PCOS (polycystic ovarian syndrome)    Retained products of conception, postpartum    SVD 11-08-2017    Past Surgical History:  Procedure Laterality Date   CYST REMOVAL FROM NECK, BENIGN  01/2017   DILATION AND EVACUATION N/A 12/14/2017   Procedure: DILATATION AND EVACUATION;  Surgeon: Estelle Service, MD;  Location: [facility];  Service: Gynecology;  Laterality: N/A;    History reviewed. No pertinent family history.  Social History:  reports that she has been smoking cigarettes. She has a 1 pack-year smoking history. She has been exposed to tobacco smoke. She has never used smokeless tobacco. She reports current alcohol use of about 1.0 standard drink of alcohol per week. She reports that she does not currently use drugs after having used the following drugs: Methamphetamines.  Allergies: Allergies[1]  Medications reviewed.    ROS Full ROS performed and is otherwise negative other than what is stated in HPI   BP 124/76   Pulse 83   Temp 98.7 F (37.1 C) (Oral)   Ht 5' 4 (1.626 m)   Wt 166 lb 12.8 oz (75.7 kg)   SpO2 100%   BMI 28.63 kg/m   Physical Exam  CONSTITUTIONAL: NAd chaperone present. EYES: Pupils are equal, round,  Sclera are non-icteric.RESPIRATORY:  There is normal respiratory effort, w and without pathologic use of accessory muscles. MUSCULOSKELETAL: Normal muscle strength and tone. No cyanosis or edema.   Right lateral thigh with fullness mid thigh, two small scars with deformity within the  skin and subcutaneous tissue. The fullness is about 15 x 10cms but w/o clear borders   Significant asymmetry SKIN: Turgor is good and there are no pathologic skin lesions or ulcers. NEUROLOGIC: Motor and sensation is grossly normal. Cranial nerves are grossly intact. PSYCH:  Oriented to person, place and time. Affect is normal.    Assessment/Plan: Right thigh soft tissue deformity related to multiple procedures,. No evidence of infection or malignant process. We will refer to plastics as they can potentially address the deformity to restore symmetry. I personally spent a total of 30 minutes in the care of the patient today including performing a medically appropriate exam/evaluation, counseling and educating, placing orders, referring and communicating with other health care professionals, documenting clinical information in the EHR, independently interpreting and reviewing images studies and coordinating care.   Laneta Luna, MD FACS General Surgeon    [1] No Known Allergies

## 2024-12-15 NOTE — Patient Instructions (Signed)
 Hematoma A hematoma is a collection of blood. A hematoma can happen: Under the skin. In an organ. In a body space. In a joint space. In other tissues. The blood can thicken (clot) to form a lump that you can see and feel. The lump is often hard and may become sore and tender. The lump can be very small or very big. Most hematomas get better in a few days to weeks. However, some of these may be serious and need medical care. What are the causes? This condition is caused by: An injury. Blood that leaks under the skin. Problems from surgeries. Medical conditions that cause bleeding or bruising. What increases the risk? You are more likely to develop this condition if: You are an older adult. You use medicines that thin your blood. You use NSAIDs, such as ibuprofen, often for pain. You play contact sports. What are the signs or symptoms? Symptoms depend on where the hematoma is in your body. If the hematoma is under the skin, there is: A firm lump on the body. Pain and tenderness in the area. Bruising. The skin above the lump may be blue, dark blue, purple-red, or yellowish. If the hematoma is deep in the tissues or body spaces, there may be: Blood in the stomach. This may cause pain in the belly (abdomen), weakness, passing out (fainting), and shortness of breath. Blood in the head. This may cause a headache, weakness, trouble speaking or understanding speech, or passing out. How is this treated? Treatment depends on the cause, size, and location of the hematoma. Treatment may include: Doing nothing. Many hematomas go away on their own without treatment. Surgery or close monitoring. This may be needed for large hematomas or hematomas that affect the body's organs. Medicines. These may be given if a medical condition caused the hematoma. Follow these instructions at home: Managing pain, stiffness, and swelling  If told, put ice on the injured area. To do this: Put ice in a plastic  bag. Place a towel between your skin and the bag. Leave the ice on for 20 minutes, 2-3 times a day for the first two days. If your skin turns bright red, take off the ice right away to prevent skin damage. The risk of skin damage is higher if you cannot feel pain, heat, or cold. If told, put heat on the injured area. Do this as often as told by your doctor. Use the heat source that your doctor recommends, such as a moist heat pack or a heating pad. Place a towel between your skin and the heat source. Leave the heat on for 20-30 minutes. If your skin turns bright red, take off the heat right away to prevent burns. The risk of burns is higher if you cannot feel pain, heat, or cold. Raise the injured area above the level of your heart while you are sitting or lying down. Wrap the affected area with an elastic bandage, if told by your doctor. Do not wrap the bandage too tight. If your hematoma is on a leg or foot and is painful, your doctor may give you crutches. Use them as told by your doctor. General instructions Take over-the-counter and prescription medicines only as told by your doctor. Keep all follow-up visits. Your doctor may want to see how your hematoma is healing with treatment. Contact a doctor if: You have a fever. The swelling or bruising gets worse. You start to get more hematomas. Your pain gets worse. Your pain is not getting better  with medicine. The skin over the hematoma breaks or starts to bleed. Get help right away if: Your hematoma is in your chest or belly and you: Pass out. Feel weak. Become short of breath. You have a hematoma on your scalp that is caused by a fall or injury, and you: Have a headache that gets worse. Have trouble speaking or understanding speech. Become less alert or you pass out. These symptoms may be an emergency. Get help right away. Call 911. Do not wait to see if the symptoms will go away. Do not drive yourself to the hospital This  information is not intended to replace advice given to you by your health care provider. Make sure you discuss any questions you have with your health care provider. Document Revised: 06/05/2022 Document Reviewed: 06/05/2022 Elsevier Patient Education  2024 [employer].

## 2025-01-21 ENCOUNTER — Ambulatory Visit

## 2025-01-21 VITALS — BP 115/79 | HR 85 | Ht 64.0 in | Wt 174.0 lb

## 2025-01-21 DIAGNOSIS — T1490XS Injury, unspecified, sequela: Secondary | ICD-10-CM | POA: Diagnosis not present

## 2025-01-21 DIAGNOSIS — T1490XA Injury, unspecified, initial encounter: Secondary | ICD-10-CM

## 2025-01-21 DIAGNOSIS — M21951 Unspecified acquired deformity of right thigh: Secondary | ICD-10-CM

## 2025-01-21 DIAGNOSIS — R208 Other disturbances of skin sensation: Secondary | ICD-10-CM
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
